# Patient Record
Sex: Female | Born: 1955 | Race: White | Hispanic: No | Marital: Married | Smoking: Former smoker
Health system: Southern US, Community
[De-identification: ages and names within clinical notes are randomized; demographics above are authoritative.]

## PROBLEM LIST (undated history)

## (undated) DIAGNOSIS — I471 Supraventricular tachycardia, unspecified: Secondary | ICD-10-CM

## (undated) DIAGNOSIS — M069 Rheumatoid arthritis, unspecified: Secondary | ICD-10-CM

## (undated) DIAGNOSIS — I4510 Unspecified right bundle-branch block: Secondary | ICD-10-CM

## (undated) DIAGNOSIS — L4050 Arthropathic psoriasis, unspecified: Secondary | ICD-10-CM

## (undated) DIAGNOSIS — E039 Hypothyroidism, unspecified: Secondary | ICD-10-CM

## (undated) DIAGNOSIS — I4891 Unspecified atrial fibrillation: Secondary | ICD-10-CM

## (undated) DIAGNOSIS — N200 Calculus of kidney: Secondary | ICD-10-CM

## (undated) DIAGNOSIS — G4730 Sleep apnea, unspecified: Secondary | ICD-10-CM

## (undated) DIAGNOSIS — T7840XA Allergy, unspecified, initial encounter: Secondary | ICD-10-CM

## (undated) HISTORY — PX: CHOLECYSTECTOMY: SHX55

## (undated) HISTORY — DX: Calculus of kidney: N20.0

## (undated) HISTORY — DX: Arthropathic psoriasis, unspecified: L40.50

## (undated) HISTORY — DX: Hypothyroidism, unspecified: E03.9

## (undated) HISTORY — DX: Unspecified right bundle-branch block: I45.10

## (undated) HISTORY — DX: Sleep apnea, unspecified: G47.30

## (undated) HISTORY — PX: ABDOMINAL HYSTERECTOMY: SHX81

## (undated) HISTORY — DX: Allergy, unspecified, initial encounter: T78.40XA

## (undated) HISTORY — PX: UPPER GASTROINTESTINAL ENDOSCOPY: SHX188

---

## 2005-03-26 HISTORY — PX: ESOPHAGOGASTRODUODENOSCOPY: SHX1529

## 2005-04-23 ENCOUNTER — Ambulatory Visit (HOSPITAL_COMMUNITY): Admission: RE | Admit: 2005-04-23 | Discharge: 2005-04-25 | Payer: Self-pay | Admitting: Neurosurgery

## 2008-03-08 HISTORY — PX: COLONOSCOPY: SHX174

## 2010-08-02 ENCOUNTER — Other Ambulatory Visit (HOSPITAL_COMMUNITY): Payer: Self-pay | Admitting: Neurosurgery

## 2010-08-02 ENCOUNTER — Encounter (HOSPITAL_COMMUNITY)
Admission: RE | Admit: 2010-08-02 | Discharge: 2010-08-02 | Disposition: A | Discharge status: Home / Self Care | Payer: No Typology Code available for payment source | Source: Ambulatory Visit | Attending: Neurosurgery | Admitting: Neurosurgery

## 2010-08-02 DIAGNOSIS — Z01818 Encounter for other preprocedural examination: Secondary | ICD-10-CM | POA: Insufficient documentation

## 2010-08-02 DIAGNOSIS — Z01812 Encounter for preprocedural laboratory examination: Secondary | ICD-10-CM | POA: Insufficient documentation

## 2010-08-02 DIAGNOSIS — Z01810 Encounter for preprocedural cardiovascular examination: Secondary | ICD-10-CM | POA: Insufficient documentation

## 2010-08-02 DIAGNOSIS — M4802 Spinal stenosis, cervical region: Secondary | ICD-10-CM

## 2010-08-02 LAB — CBC
MCH: 33.2 pg (ref 26.0–34.0)
MCHC: 34.2 g/dL (ref 30.0–36.0)
Platelets: 337 10*3/uL (ref 150–400)
RBC: 4.31 MIL/uL (ref 3.87–5.11)

## 2010-08-02 LAB — BASIC METABOLIC PANEL
GFR calc Af Amer: >60 mL/min (ref >60)
GFR calc non Af Amer: >60 mL/min (ref >60)
Potassium: 4.1 mEq/L (ref 3.5–5.1)
Sodium: 141 mEq/L (ref 135–145)

## 2010-08-07 ENCOUNTER — Inpatient Hospital Stay (HOSPITAL_COMMUNITY)
Admission: RE | Admit: 2010-08-07 | Payer: No Typology Code available for payment source | Source: Ambulatory Visit | Admitting: Neurosurgery

## 2010-08-28 ENCOUNTER — Observation Stay (HOSPITAL_COMMUNITY)
Admission: EM | Admit: 2010-08-28 | Discharge: 2010-08-29 | Disposition: A | Discharge status: Home / Self Care | Payer: No Typology Code available for payment source | Attending: Internal Medicine | Admitting: Internal Medicine

## 2010-08-28 ENCOUNTER — Observation Stay (HOSPITAL_COMMUNITY): Payer: No Typology Code available for payment source

## 2010-08-28 ENCOUNTER — Encounter (HOSPITAL_COMMUNITY): Payer: Self-pay | Admitting: Radiology

## 2010-08-28 ENCOUNTER — Emergency Department (HOSPITAL_COMMUNITY): Payer: No Typology Code available for payment source

## 2010-08-28 ENCOUNTER — Encounter: Payer: Self-pay | Admitting: Internal Medicine

## 2010-08-28 DIAGNOSIS — R079 Chest pain, unspecified: Secondary | ICD-10-CM

## 2010-08-28 DIAGNOSIS — I10 Essential (primary) hypertension: Principal | ICD-10-CM | POA: Insufficient documentation

## 2010-08-28 DIAGNOSIS — I4891 Unspecified atrial fibrillation: Secondary | ICD-10-CM | POA: Insufficient documentation

## 2010-08-28 DIAGNOSIS — R51 Headache: Secondary | ICD-10-CM | POA: Insufficient documentation

## 2010-08-28 DIAGNOSIS — Z9849 Cataract extraction status, unspecified eye: Secondary | ICD-10-CM | POA: Insufficient documentation

## 2010-08-28 DIAGNOSIS — I252 Old myocardial infarction: Secondary | ICD-10-CM | POA: Insufficient documentation

## 2010-08-28 DIAGNOSIS — E039 Hypothyroidism, unspecified: Secondary | ICD-10-CM | POA: Insufficient documentation

## 2010-08-28 DIAGNOSIS — L4050 Arthropathic psoriasis, unspecified: Secondary | ICD-10-CM | POA: Insufficient documentation

## 2010-08-28 DIAGNOSIS — M069 Rheumatoid arthritis, unspecified: Secondary | ICD-10-CM | POA: Insufficient documentation

## 2010-08-28 DIAGNOSIS — M4800 Spinal stenosis, site unspecified: Secondary | ICD-10-CM | POA: Insufficient documentation

## 2010-08-28 HISTORY — DX: Unspecified atrial fibrillation: I48.91

## 2010-08-28 HISTORY — DX: Hypothyroidism, unspecified: E03.9

## 2010-08-28 LAB — BASIC METABOLIC PANEL
BUN: 9 mg/dL (ref 6–23)
Calcium: 9.2 mg/dL (ref 8.4–10.5)
Creatinine, Ser: 0.57 mg/dL (ref 0.50–1.10)
GFR calc Af Amer: >60 mL/min (ref >60)
Glucose, Bld: 127 mg/dL — ABNORMAL HIGH (ref 70–99)

## 2010-08-28 LAB — DIFFERENTIAL
Basophils Relative: 0 % (ref 0–1)
Eosinophils Absolute: 0.3 10*3/uL (ref 0.0–0.7)
Eosinophils Relative: 2 % (ref 0–5)
Lymphocytes Relative: 17 % (ref 12–46)
Lymphs Abs: 3 10*3/uL (ref 0.7–4.0)
Monocytes Absolute: 0.8 10*3/uL (ref 0.1–1.0)
Monocytes Relative: 4 % (ref 3–12)
Neutro Abs: 13.4 10*3/uL — ABNORMAL HIGH (ref 1.7–7.7)

## 2010-08-28 LAB — CBC
MCH: 33.2 pg (ref 26.0–34.0)
MCHC: 34.3 g/dL (ref 30.0–36.0)
Platelets: 364 10*3/uL (ref 150–400)
RDW: 14.7 % (ref 11.5–15.5)

## 2010-08-28 LAB — HEPATIC FUNCTION PANEL: ALT: 21 U/L (ref 0–35)

## 2010-08-28 LAB — CK TOTAL AND CKMB (NOT AT ARMC)
CK, MB: 2.1 ng/mL (ref 0.3–4.0)
Relative Index: INVALID (ref 0.0–2.5)
Total CK: 49 U/L (ref 7–177)

## 2010-08-28 LAB — CARDIAC PANEL(CRET KIN+CKTOT+MB+TROPI)
Total CK: 45 U/L (ref 7–177)
Troponin I: <0.3 ng/mL (ref <0.30)

## 2010-08-28 LAB — DIGOXIN LEVEL: Digoxin Level: 0.9 ng/mL (ref 0.8–2.0)

## 2010-08-28 NOTE — H&P (Signed)
Hospital Admission Note Date: 08/28/2010  Patient name: Tina Welch Medical record number: [id number] Date of birth: November 15, 1955 Age: 55 y.o. Gender: female PCP: Temple Pacini, MD  Medical Service: B1  Attending physician:  Josem Kaufmann               Pager: [phone] Resident (R2/R3): Vernice Jefferson [phone] Pager: [phone] Medical Student (R1): Ria Comment              Pager: [phone]  Chief Complaint:Dizzy, Headache  History of Present Illness:Tina Welch is a 55 yo female with a past medical history significant for hypothyroid, RA, psoriasis, psoriatic arthritis, MI, and ruptured discs who presents to the ED with dizziness, headache, and feeling light-headed. Patient states her feelings of nausea and headache began yesterday when she woke up and have progressively worsened since, with the headache (descrobed as head pressure) constant but intermittently more severe. She describes the headache as head pressure and a "vice grip", feeling as if her head is too big for her neck. Patient also feels she has had delayed reaction time and slower overall movements for the past 2 days. She claims she got very carsick yesterday, which is unusual for her and admits to eating less (soup yesterday and a few crackers today) due to a "queasy stomach". She endorses insomnia last night due to not being able to get comfortable. This morning the patient awoke with left sided chest pressure with radiation to her left arm and left neck with associated shortness of breath and stomach ache. Patient denies feeling flu-like during this time, palpitations, RHR, or chest pain, though patient does admit that this felt somewhat similar to her previous MI (in her 40's) and was similarly quickly relieved with Nitro given in the ED. Today at work patient was found to have BP of 190/"100-something". Patient denies vomiting, syncope, blurry vision, flashes of light, scotomas, or unusual numbness/tingling/weakness or loss of sensation to  face or extremities.   Of note, patient claims her thyroid is due to be checked soon and does endorse feeling tremors/shakey for the past few weeks and increased swelling to her eyes. Denies changes to heat/cold intolerance, weight changes, edema or changes to appetite.   Meds:  Allergies: Avolox- neck swelling and rash  Past Medical History  Diagnosis Date  . A-fib   . Hypothyroid   -Obesity -MI: in early 40's, unremarkable angioplasty, found to have a fib and RBBB, currently treated with Digoxin and Metoprolol -Rheumatoid Arthritis -Psoriasis -Psoriatic Arthritis -Ruptured Discs (lumbar-repaired, three current ruptured cervical discs pending upcoming surgical repair) due to Spinal Stenosis -chronic back pain -cataracts-bilateral-repaired  PCP: Dr. Fatima Blank in [place]  Surgical History: -Right L4/L5 microdiscectomy -bilateral cataract repair -Hysterectomy due to endometriosis in 1983 -Cholecystectomy in 1985  Family History: -Father: CRC, A Fib -Mother: HLD, Osteoporosis -HTN and Heart Disease: "both mother's and father's side) -Stroke- "father's side"  History   Social History  . Marital Status: Married    Spouse Name: N/A    Number of Children: 2 kids  . Years of Education: N/A  -Patient was born in [place]. [place] and now lives in [place]. 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 Patient denies any recent stressors. -For exercise patient walks 2-3 times per week. She claims her diet is well-balanced but believes her portions are too large.    Occupational History   Radiographer, therapeutic   Social History Main Topics  . Smoking status: Not on file  . Smokeless tobacco: Not  on file  . Alcohol Use: Not on file  . Drug Use: Not on file  . Sexually Active: Not on file   Other Topics Concern  . Not on file   Social History Narrative  . No narrative on file    Review of Systems: Pertinent items are noted in HPI. Head: Denies head trauma, vomiting  or visual changes Ears: Denies tinnitus Nose: Denies congestion, rhinorrhea Mouth: Denies sore throat Cardiac: as per HPI.  Resp: as per HPI. Resolved SOB (brief episode this morning with her chest pressure) GI: Denies changes to bowel habits such as diarrhea or constipation. Denies abdominal pain.  Neuro: Denies syncope or pain. Endorses chronic tingling and numbness to bilateral hands due to current ruptured cervical discs. Denies paralysis or weakness. Endorses increased tremors for past few weeks.  Endo: as per HPI Psych: Denies anxiety or depression  Physical Exam: Temp 98.6   HR  55    BP  124/80 (125/69)  RR   12          96% on ra General appearance: alert and cooperative Head: Normocephalic, without obvious abnormality, atraumatic Eyes: Increased conjunctival injection bilaterally, EOMI, sclerae anicteric. PERRLA. Neck: no adenopathy, no carotid bruit, no JVD, supple, symmetrical, trachea midline and thyroid not enlarged, symmetric, no tenderness/mass/nodules Lungs: clear to auscultation bilaterally, no increased work of breathing Heart: regular rate and rhythm, normal S1 and S2, no murmurs/rubs/or gallops Abdomen: soft, non-tender; bowel sounds normal; no masses,  no organomegaly Extremities: extremities normal, atraumatic, no cyanosis or edema Pulses: 3+ radial and posterior tibialis pulses bilaterally Skin: Skin color, texture, turgor normal. No rashes or lesions Neurologic: CN 2-12 intact and individually tested, facial movement symmetric. Downgoing babinski. Strength 5-/5 to right dorsiflexion and knee extension.  All other lower extremity strength 5/5 bilaterally. UE strength bilaterally 5-/5 to wrist extension and flexion and grip strength (concurrent with disc rupture, per patient). 3+ radial and achilles reflexes bilaterally.  Lab results:  CBC:    Component Value Date/Time   WBC 17.4* 08/28/2010 1322   HGB 14.4 08/28/2010 1322   HCT 42.0 08/28/2010 1322   PLT 364  08/28/2010 1322   MCV 96.8 08/28/2010 1322   NEUTROABS 13.4* 08/28/2010 1322   LYMPHSABS 3.0 08/28/2010 1322   MONOABS 0.8 08/28/2010 1322   EOSABS 0.3 08/28/2010 1322   BASOSABS 0.1 08/28/2010 1322     Comprehensive Metabolic Panel:    Component Value Date/Time   NA 142 08/28/2010 1322   K 4.5 08/28/2010 1322   CL 107 08/28/2010 1322   CO2 29 08/28/2010 1322   BUN 9 08/28/2010 1322   CREATININE 0.57 08/28/2010 1322   GLUCOSE 127* 08/28/2010 1322   CALCIUM 9.2 08/28/2010 1322   Digoxin                                  0.9               0.8-2.0          Ng/mL  Creatine Kinase, Total                   49                7-177            U/L CK, MB  2.1               0.3-4.0          Ng/mL  Troponin I                               <0.30             <0.30            Ng/mL   Imaging results:   CT He IMPRESSION:   Low density in the occipital regions with punctate hyperdense foci.   Findings probably represent brain edema related to posterior   reversible encephalopathy with punctate hemorrhage.   Other results:  Assessment & Plan by Problem: Patient is a 55 yo female with a past medical history significant for MI, RA, psoriatic arthritis, hypothyroid and ruptured discs 2/2 spinal stenosis who presented to the ED today with 2 days of progressive head pressure, nausea and recent chest pressure and brief shortness of breath. Differential diagnosis includes PRES, hypertensive encephalopathy, MI, stroke, and pseudotumor cerebri. Most likely diagnosis is PRES, due to neuro consult suggestive of PRES along with CT findings consistent with PRES, as well as her clinical symptoms and history (headache, psychomotor slowing) and risk factors (MTX). However, must rule out MI given past history of MI, as well as pseudotumor cerebri given risk factor of obesity and middle age.   1) Hypertension: Patient reports HTN of 190/100's prompting her visit to ED today, but has been  normotensive since admission.  -Will monitor BP and will treat DBP with goal of less than 100 (UpToDate) -Will continue home Digoxin 0.25 mg Qday and home Metoprolol 25 mg Qday -will monitor rhythm with telemetry -pending labs: TSH, hepatic function panel, am CBC and BMET -will obtain MRI/MRA to further investigate puncttate hemorrhages, vs stroke vs PRES  2) Chest Pressure: Given history of chest pressure today similar to that of previous MI, relieved with Nitro, will follow serial cardiac enzymes.  -1st set CE wnl -pending 2 cycles of CE (q6hr) -12 leak EKG in am -Omeprazole 20 mg 1 cap po qday -Asa 81 mg po qday -Tylenol 650 mg 1 tab prn q6hr pain/fever  3) Hypothyroid: -pending TFT's -continue home Synthroid 112 mcg 1 tab PO qday  4) RA: -continue home MTX 2.5 mg po 2 tab on Wed, Th, and 1 tab on Fri and Folic Acid 1 mg po qday  PPX: Lovenox 40 mg [place] Qday Maintenance: continue home Zytrec, Dicyclomine 10 mg po qday

## 2010-08-28 NOTE — H&P (Signed)
Hospital Admission Note Date: 08/28/2010  Patient name: Tina Welch Medical record number: [id number] Date of birth: 1955/04/09 Age: 55 y.o. Gender: female PCP: Temple Pacini, MD  Medical Service: B1  Attending physician:  Dr. Josem Kaufmann                Resident (R2/R3): Laurel Dimmer [phone]  Pager: [phone] Medical Student (R1): Ria Comment              Pager: [phone]  Chief Complaint:Dizzy, Headache  History of Present Illness:[name] is a 55 yo female with a past medical history significant for hypothyroid, RA, psoriasis, psoriatic arthritis, MI, and ruptured discs who presents to the ED with dizziness, headache, and feeling light-headed. Patient states her feelings of nausea and headache began yesterday when she woke up and have progressively worsened since, with the headache (descrobed as head pressure) constant but intermittently more severe. She describes the headache as head pressure and a "vice grip", feeling as if her head is too big for her neck. Patient also feels she has had delayed reaction time and slower overall movements for the past 2 days. She claims she got very carsick yesterday, which is unusual for her and admits to eating less (soup yesterday and a few crackers today) due to a "queasy stomach". She endorses insomnia last night due to not being able to get comfortable. This morning the patient awoke with left sided chest pressure with radiation to her left arm and left neck with associated shortness of breath and stomach ache. Patient denies feeling flu-like during this time, palpitations, RHR, or chest pain, though patient does admit that this felt somewhat similar to her previous MI (in her 40's) and was similarly quickly relieved with Nitro given in the ED. Today at work patient was found to have BP of 190/"100-something". Patient denies vomiting, syncope, blurry vision, flashes of light, scotomas, or unusual numbness/tingling/weakness or loss of sensation to face or  extremities.   Of note, patient claims her thyroid is due to be checked soon and does endorse feeling tremors/shakey for the past few weeks and increased swelling to her eyes. Denies changes to heat/cold intolerance, weight changes, edema or changes to appetite.   Meds:  Allergies: Avelox- neck swelling and rash  Past Medical History  Diagnosis Date  . A-fib   . Hypothyroid   -Obesity -MI: in early 40's, unremarkable angioplasty, found to have a fib and RBBB, currently treated with Digoxin and Metoprolol -Rheumatoid Arthritis -Psoriasis -Psoriatic Arthritis -Ruptured Discs (lumbar-repaired, three current ruptured cervical discs pending upcoming surgical repair) due to Spinal Stenosis -chronic back pain -cataracts-bilateral-repaired  PCP: Dr. Fatima Blank in [place]  Surgical History: -Right L4/L5 microdiscectomy -bilateral cataract repair -Hysterectomy due to endometriosis in 1983 -Cholecystectomy in 1985  Family History: -Father: CRC, A Fib -Mother: HLD, Osteoporosis -HTN and Heart Disease: "both mother's and father's side) -Stroke- "father's side"  History   Social History  . Marital Status: Married    Spouse Name: N/A    Number of Children: 2 kids  . Years of Education: N/A  -Patient was born in [place] and now lives in [place]. 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 Patient denies any recent stressors. -For exercise patient walks 2-3 times per week. She claims her diet is well-balanced but believes her portions are too large.    Occupational History   Radiographer, therapeutic    Review of Systems: Pertinent items are noted in HPI. Head: Denies head trauma, vomiting  or visual changes Ears: Denies tinnitus Nose: Denies congestion, rhinorrhea Mouth: Denies sore throat Cardiac: as per HPI.  Resp: as per HPI. Resolved SOB (brief episode this morning with her chest pressure) GI: Denies changes to bowel habits such as diarrhea or constipation.  Denies abdominal pain.  Neuro: Denies syncope or pain. Endorses chronic tingling and numbness to bilateral hands due to current ruptured cervical discs. Denies paralysis or weakness. Endorses increased tremors for past few weeks.  Endo: as per HPI Psych: Denies anxiety or depression  Physical Exam: Temp 98.6   HR  55    BP  124/80 (125/69)  RR   12          96% on ra General appearance: alert and cooperative Head: Normocephalic, without obvious abnormality, atraumatic Eyes: Increased conjunctival injection bilaterally, EOMI, sclerae anicteric. PERRLA. Neck: no adenopathy, no carotid bruit, no JVD, supple, symmetrical, trachea midline and thyroid not enlarged, symmetric, no tenderness/mass/nodules Lungs: clear to auscultation bilaterally, no increased work of breathing Heart: regular rate and rhythm, normal S1 and S2, no murmurs/rubs/or gallops Abdomen: soft, non-tender; bowel sounds normal; no masses,  no organomegaly Extremities: extremities normal, atraumatic, no cyanosis or edema Pulses: 3+ radial and posterior tibialis pulses bilaterally Skin: Skin color, texture, turgor normal. No rashes or lesions Neurologic: CN 2-12 intact and individually tested, facial movement symmetric. Downgoing babinski. Strength 5-/5 to right dorsiflexion and knee extension.  All other lower extremity strength 5/5 bilaterally. UE strength bilaterally 5-/5 to wrist extension and flexion and grip strength (concurrent with disc rupture, per patient). 3+ radial and achilles reflexes bilaterally.  Lab results:  CBC:    Component Value Date/Time   WBC 17.4* 08/28/2010 1322   HGB 14.4 08/28/2010 1322   HCT 42.0 08/28/2010 1322   PLT 364 08/28/2010 1322   MCV 96.8 08/28/2010 1322   NEUTROABS 13.4* 08/28/2010 1322   LYMPHSABS 3.0 08/28/2010 1322   MONOABS 0.8 08/28/2010 1322   EOSABS 0.3 08/28/2010 1322   BASOSABS 0.1 08/28/2010 1322     Comprehensive Metabolic Panel:    Component Value Date/Time   NA 142 08/28/2010 1322   K  4.5 08/28/2010 1322   CL 107 08/28/2010 1322   CO2 29 08/28/2010 1322   BUN 9 08/28/2010 1322   CREATININE 0.57 08/28/2010 1322   GLUCOSE 127* 08/28/2010 1322   CALCIUM 9.2 08/28/2010 1322   AST 16 08/28/2010 1636   ALT 21 08/28/2010 1636   ALKPHOS 79 08/28/2010 1636   BILITOT 0.3 08/28/2010 1636   PROT 6.8 08/28/2010 1636   ALBUMIN 3.5 08/28/2010 1636   Digoxin                                  0.9               0.8-2.0          Ng/mL  Creatine Kinase, Total                   49                7-177            U/L CK, MB                                          2.1  0.3-4.0          Ng/mL  Troponin I                               <0.30             <0.30            Ng/mL   Imaging results:   CT Head IMPRESSION:   Low density in the occipital regions with punctate hyperdense foci.   Findings probably represent brain edema related to posterior   reversible encephalopathy with punctate hemorrhage.   Assessment & Plan by Problem: Patient is a 55 yo female with a past medical history significant for MI, RA, psoriatic arthritis, hypothyroid and ruptured discs 2/2 spinal stenosis who presented to the ED today with 2 days of progressive head pressure, nausea and recent chest pressure and brief shortness of breath.   1.) Dizziness with elevated blood pressure: Differential diagnosis includes PRES, hypertensive encephalopathy, MI, stroke, orthostatic hypotension, medications, dehydration, electrolyte imbalances, and pseudotumor cerebri. Orthostatic vitals were checked at bedside and patient was not found to be orthostatic. Labs were within normal limits with no evidence of electrolyte abnormalities. Patient not on any opiate medications, and clinically did not appear to be dehydrated. CT scan findings suggestive of PRES given brain edema and punctate hemorrhages as well as her clinical symptoms and history (headache, psychomotor slowing). Causes for PRES include malignant hypertension, and patient had blood  pressure in the 180s which was significantly elevated for her given her baseline blood pressure of less than 100 systolic. Immunosuppressive therapy is also a risk factor and patient is on methotrexate for RA. Also, must rule out MI given past history of MI, as well as pseudotumor cerebri given risk factor of obesity and middle age.  Plan: -Admit to telemetry -Will check MRI/MRA to rule out brain edema and PRES as suggested by curbside neuro consult -Will cycle cardiac enzymes and repeat EKG in am -Will f/u imaging studies and discuss immunosuppressive therapy with attending physician   2) Hypertension: Patient reports HTN of 190/100's prompting her visit to ED today, but has been normotensive since admission.  -Will monitor BP and will treat DBP with goal of less than 100 (UpToDate) -Will continue home Digoxin 0.25 mg Qday and home Metoprolol 25 mg Qday -will monitor rhythm with telemetry  3) Chest Pressure: Given history of chest pressure today similar to that of previous MI, relieved with Nitro, will follow serial cardiac enzymes.  -1st set CE wnl -pending 2 cycles of CE (q6hr) -12 leak EKG in am -Omeprazole 20 mg 1 cap po qday -Asa 81 mg po qday -Tylenol 650 mg 1 tab prn q6hr pain/fever  4) Hypothyroid: -pending TFT's -continue home Synthroid 112 mcg 1 tab PO qday  5) RA: -continue home MTX 2.5 mg po 2 tab on Wed, Th, and 1 tab on Fri and Folic Acid 1 mg po qday  6) A-fib - rate controlled, and on digoxin. [place] VASC score is low and thus she does not need anticoagulation and furthermore, she has punctate hemorrhages on CT.   PPX: Lovenox 40 mg [place] Qday Maintenance: continue home Zytrec, Dicyclomine 10 mg po qday

## 2010-08-28 NOTE — H&P (Incomplete)
Hospital Admission Note Date: 08/28/2010  Patient name: Tina Welch Medical record number: [id number] Date of birth: Mar 14, 1955 Age: 55 y.o. Gender: female PCP: Temple Pacini, MD  Medical Service:  Attending physician:     Pager: Resident (R2/R3):     Pager: Resident (R1):     Pager:  Chief Complaint:  History of Present Illness:  Meds:  Allergies:   Past Medical History  Diagnosis Date  . A-fib   . Hypothyroidism    Surgical History:   No family history on file.  History   Social History  . Marital Status: Married    Spouse Name: N/A    Number of Children: N/A  . Years of Education: N/A   Occupational History  . Not on file.   Social History Main Topics  . Smoking status: Not on file  . Smokeless tobacco: Not on file  . Alcohol Use: Not on file  . Drug Use: Not on file  . Sexually Active: Not on file   Other Topics Concern  . Not on file   Social History Narrative  . No narrative on file    Review of Systems: {Review Of Systems:30496}  Physical Exam:  There were no vitals filed for this visit. {female exam, choose systems:17926} {female exam, choose systems:17872}  Lab results:  Imaging results:   Other results:  Assessment & Plan by Problem:

## 2010-08-29 DIAGNOSIS — R079 Chest pain, unspecified: Secondary | ICD-10-CM

## 2010-08-29 LAB — BASIC METABOLIC PANEL
BUN: 10 mg/dL (ref 6–23)
Chloride: 105 mEq/L (ref 96–112)
GFR calc Af Amer: >60 mL/min (ref >60)
GFR calc non Af Amer: >60 mL/min (ref >60)
Glucose, Bld: 105 mg/dL — ABNORMAL HIGH (ref 70–99)
Potassium: 4.1 mEq/L (ref 3.5–5.1)
Sodium: 139 mEq/L (ref 135–145)

## 2010-08-29 LAB — CBC
HCT: 39.8 % (ref 36.0–46.0)
Hemoglobin: 13.3 g/dL (ref 12.0–15.0)
MCH: 32.4 pg (ref 26.0–34.0)
MCHC: 33.4 g/dL (ref 30.0–36.0)
Platelets: 351 10*3/uL (ref 150–400)

## 2010-08-29 LAB — CARDIAC PANEL(CRET KIN+CKTOT+MB+TROPI)
CK, MB: 1.7 ng/mL (ref 0.3–4.0)
Troponin I: <0.3 ng/mL (ref <0.30)

## 2010-09-06 ENCOUNTER — Encounter (HOSPITAL_COMMUNITY)
Admission: RE | Admit: 2010-09-06 | Discharge: 2010-09-06 | Disposition: A | Discharge status: Home / Self Care | Payer: No Typology Code available for payment source | Source: Ambulatory Visit | Attending: Neurosurgery | Admitting: Neurosurgery

## 2010-09-06 LAB — BASIC METABOLIC PANEL
GFR calc Af Amer: >60 mL/min (ref >60)
GFR calc non Af Amer: >60 mL/min (ref >60)
Potassium: 4.8 mEq/L (ref 3.5–5.1)
Sodium: 140 mEq/L (ref 135–145)

## 2010-09-06 LAB — CBC
Hemoglobin: 14 g/dL (ref 12.0–15.0)
MCHC: 32.9 g/dL (ref 30.0–36.0)
RDW: 15.1 % (ref 11.5–15.5)

## 2010-09-06 LAB — DIFFERENTIAL
Basophils Absolute: 0.1 10*3/uL (ref 0.0–0.1)
Basophils Relative: 0 % (ref 0–1)
Eosinophils Relative: 3 % (ref 0–5)
Monocytes Absolute: 0.7 10*3/uL (ref 0.1–1.0)
Neutro Abs: 8.5 10*3/uL — ABNORMAL HIGH (ref 1.7–7.7)

## 2010-09-06 LAB — TYPE AND SCREEN: Antibody Screen: NEGATIVE

## 2010-09-08 ENCOUNTER — Inpatient Hospital Stay (HOSPITAL_COMMUNITY)
Admission: RE | Admit: 2010-09-08 | Discharge: 2010-09-09 | DRG: 473 | Disposition: A | Discharge status: Home / Self Care | Payer: No Typology Code available for payment source | Source: Ambulatory Visit | Attending: Neurosurgery | Admitting: Neurosurgery

## 2010-09-08 ENCOUNTER — Inpatient Hospital Stay (HOSPITAL_COMMUNITY): Payer: No Typology Code available for payment source

## 2010-09-08 DIAGNOSIS — M47812 Spondylosis without myelopathy or radiculopathy, cervical region: Principal | ICD-10-CM | POA: Diagnosis present

## 2010-09-08 DIAGNOSIS — E039 Hypothyroidism, unspecified: Secondary | ICD-10-CM | POA: Diagnosis present

## 2010-09-08 DIAGNOSIS — I498 Other specified cardiac arrhythmias: Secondary | ICD-10-CM | POA: Diagnosis present

## 2010-09-08 DIAGNOSIS — M5020 Other cervical disc displacement, unspecified cervical region: Secondary | ICD-10-CM | POA: Diagnosis present

## 2010-09-08 DIAGNOSIS — Z7982 Long term (current) use of aspirin: Secondary | ICD-10-CM

## 2010-09-08 DIAGNOSIS — Z01812 Encounter for preprocedural laboratory examination: Secondary | ICD-10-CM

## 2010-09-08 DIAGNOSIS — Z79899 Other long term (current) drug therapy: Secondary | ICD-10-CM

## 2010-09-09 ENCOUNTER — Emergency Department (HOSPITAL_COMMUNITY)
Admission: EM | Admit: 2010-09-09 | Discharge: 2010-09-10 | Disposition: A | Discharge status: Home / Self Care | Payer: No Typology Code available for payment source | Attending: Emergency Medicine | Admitting: Emergency Medicine

## 2010-09-09 ENCOUNTER — Emergency Department (HOSPITAL_COMMUNITY): Payer: No Typology Code available for payment source

## 2010-09-09 DIAGNOSIS — R061 Stridor: Secondary | ICD-10-CM | POA: Insufficient documentation

## 2010-09-09 DIAGNOSIS — R062 Wheezing: Secondary | ICD-10-CM | POA: Insufficient documentation

## 2010-09-09 DIAGNOSIS — R0602 Shortness of breath: Secondary | ICD-10-CM | POA: Insufficient documentation

## 2010-09-09 DIAGNOSIS — E039 Hypothyroidism, unspecified: Secondary | ICD-10-CM | POA: Insufficient documentation

## 2010-09-09 DIAGNOSIS — Z981 Arthrodesis status: Secondary | ICD-10-CM | POA: Insufficient documentation

## 2010-09-09 DIAGNOSIS — F411 Generalized anxiety disorder: Secondary | ICD-10-CM | POA: Insufficient documentation

## 2010-09-09 DIAGNOSIS — M069 Rheumatoid arthritis, unspecified: Secondary | ICD-10-CM | POA: Insufficient documentation

## 2010-09-09 LAB — CBC
MCH: 32.3 pg (ref 26.0–34.0)
MCHC: 33.9 g/dL (ref 30.0–36.0)
Platelets: 361 10*3/uL (ref 150–400)

## 2010-09-09 LAB — DIFFERENTIAL
Basophils Relative: 0 % (ref 0–1)
Eosinophils Absolute: 0.1 10*3/uL (ref 0.0–0.7)
Eosinophils Relative: 1 % (ref 0–5)
Monocytes Absolute: 0.9 10*3/uL (ref 0.1–1.0)
Monocytes Relative: 5 % (ref 3–12)

## 2010-09-09 LAB — BASIC METABOLIC PANEL
Calcium: 9.4 mg/dL (ref 8.4–10.5)
GFR calc non Af Amer: >60 mL/min (ref >60)
Glucose, Bld: 117 mg/dL — ABNORMAL HIGH (ref 70–99)
Sodium: 141 mEq/L (ref 135–145)

## 2010-09-13 ENCOUNTER — Other Ambulatory Visit (HOSPITAL_COMMUNITY): Payer: No Typology Code available for payment source

## 2010-09-13 NOTE — Discharge Summary (Signed)
NAME:  Tina Welch, Tina Welch.:  [id number]  MEDICAL RECORD NO.:  [id number]  LOCATION:  3734                         FACILITY:  [facility]  PHYSICIAN:  Doneen Poisson, MD     DATE OF BIRTH:  30-Apr-1955  DATE OF ADMISSION:  08/28/2010 DATE OF DISCHARGE:  08/29/2010                              DISCHARGE SUMMARY   DISCHARGE DIAGNOSES: 1. Hypertensive urgency. 2. Chronic problems.     a.     Atrial fibrillation.     b.     Hypothyroid.     c.     History of myocardial infarction in early 82s with      unremarkable angioplasty.     d.     Rheumatoid arthritis.     e.     Psoriasis.     f.     Psoriatic arthritis.     g.     Spinal stenosis.     h.     Repaired bilateral cataracts.  DISCHARGE MEDICATIONS: 1. Zyrtec 10 mg p.o. daily. 2. Synthroid 100 mcg 1 tablet p.o. daily. 3. Omeprazole 20 mg 1 capsule p.o. daily. 4. Metoprolol 25 mg XL p.o. daily. 5. Methotrexate 2.5 mg p.o. 2 tablets on Wednesday and Thursday and 1     tablet on Friday. 6. Folic acid 1 mg p.o. daily. 7. Digoxin 0.25 mg 1 tablet p.o. daily. 8. Cymbalta 60 mg p.o. daily. 9. Aspirin 81 mg p.o. daily. 10.Tylenol 650 mg 1 tablet p.o. q.6 h. p.r.n. pain or fever.  DISPOSITION AND FOLLOWUP: a.  Tina Welch is to see her PCP, Dr. Georg Ruddle, [phone]  on August 31, 2010, at 3:45 p.m.  She is to get free T4 drawn and follow  up regarding Synthroid dosing. b.  She is scheduled to obtain a stress echo on September 13, 2010, at 1:30 at [facility] for her preoperative clearance per Dr. Jordan Likes.  The patient is to arrive by 1 p.m.  PROCEDURES PERFORMED:  None.  CONSULTATIONS:  None.  BRIEF ADMITTING HISTORY AND PHYSICAL:  Tina Welch is a 55 year old woman with a past medical history significant for hypothyroidism, RA, psoriasis, psoriatic arthritis, MI, and ruptured disk who presents to the ED with dizziness, headache, and feeling lightheaded.  She states her feelings of nausea and headache  began yesterday when she woke up and had progressively worsened since then with the headache described as head pressure, constant, but intermittently more severe.  She describes the headache as head pressure and a vise grip, feeling as if her head is too big for her neck.  She also feels she has had delayed reaction time and slower overall movements for the past 2 days.  She claims she got very carsick yesterday, which was unusual for her and admits to eating less (soup yesterday and a few crackers today) due to a "queasy stomach."  She endorses insomnia last night due to not being able to get comfortable.  This morning she awoke with left-sided chest pressure with radiation to her left arm and left neck with associated shortness of breath and stomachache.  She denies feeling flu-like during this time, palpitations, RHR, or chest  pain, though she does admit that this felt somewhat similar to her previous MI in her 60s and was similarly quickly relieved with nitro given in the ED.  Today at work, she was found to have a blood pressure of 198/"100 something."  She denied vomiting, syncope, blurry vision, flashes of light, scotomas, or unusual numbness, tingling, weakness, or loss of sensation to face or extremities.  Of note, she claims her thyroid is due to be checked soon and does endorse feeling tremors for the past few weeks and increased swelling to her eye.  She denies changes to heat or cold intolerance, weight changes, edema, or changes to appetite.  On admission, she was normotensive with stable vital signs. Head CT was concerning for PRES given evidence of cerebral edema with punctate hemorrhages, however, MRI was unremarkable.  She was given nitroglycerin in the ED and serial cardiac enzymes obtained were all normal.  PHYSICAL EXAMINATION: VITAL SIGNS:  Temperature 98.6, heart rate 55, blood pressure 124/80,  RR 12, and O2 sat 96% on room air. GENERAL:  Alert, oriented,  cooperative, well groomed, no acute distress. HEENT:  NCAT.  Increased conjunctival injection bilaterally.  EOMI. Sclerae anicteric.  PERRLA.  No JVD or adenopathy.  MMM. RESPIRATORY:  CTAB.  No increased work of breathing. CARDIAC:  RRR.  Normal S1 and S2.  No murmurs audible.  +3 radial and posterior tibial pulses bilaterally. ABDOMEN:  Soft, nontender, and nondistended.  NABS.  No masses or organomegaly. EXTREMITIES:  No cyanosis or edema.  Atraumatic. SKIN:  Normal skin turgor.  No rashes or lesions. NEUROLOGIC:  Cranial nerves II through XII intact and individually tested.  Facial movements symmetric.  Downgoing Babinski.  Strength 5-/5 to right dorsiflexion and knee extension; all other lower extremity strength 5/5 bilaterally.  Upper extremity strength 5-/5 to wrist extension and flexion and grip strength, (concurrent with disk rupture per the patient).  Brisk 3+ reflexes to brachialis and Achilles tendon bilaterally.  ADMISSION LABORATORY DATA:  White blood cell count 17.4, H and H 14.4 and 42.0, platelet 364, MCV 96.8, absolute neutrophils 13.4, absolute lymphocytes 3.0, monocytes 0.8.  Sodium 142, potassium 4.5, chloride 107, CO2 29, BUN 9, creatinine 0.57, glucose 127, calcium 9.2. Digoxin 0.9.  CK 49, CK-MB 2.1, troponins less than 0.3.  TSH 0.031.  HOSPITAL COURSE: 1. Hypertensive urgency:  Ms. Jeangilles reports progressive increased     head pressure, headache for 2 days with nausea, tremors, queasy     stomach, motor retardation, and blood pressure of 190s/100, taken     by the local [employer] immediately before arrival to the ED     yesterday; however, she has been normotensive since admission.     Various etiologies for dizziness were investigated.  Orthostatics     were tested and she was not found to have orthostatic     hypotension.  She was not dehydrated and labs obtained also did not     indicate dehydration given BUN and creatinine within normal  limits.     Electrolyte abnormalities not a cause of her symptoms as basic     metabolic panel found no abnormalities.  Cardiac etiology of     hypertension and tremors investigated.  She was monitored on     telemetry throughout the hospital stay with no events.  Unremarkable     ECGs throughout the hospital course and serial cardiac enzymes did not     indicate MI.  Head CT obtained in the ED suggested  cerebral edema     and punctate hemorrhages indicative of PRES as possible cause of     her symptoms, however, the head MRI obtained the night of     admission was unremarkable and did not show any signs of edema or     hemorrhage.  Additionally, TSH was significant for marked     suppression to 0.031, consistent with an excess exogenous     Synthroid causing hypertensive urgency.  Given her     suppressed TSH, history of recent tremors and hypertensive urgency,     the Synthroid dose was decreased from 112 mcg per day to a more ideal     dose of 100 mcg per day on the day of discharge.  PCP to follow up     with her regarding her Synthroid dose and to recheck her     free T4.  2. Chest pain:  Ms. Jupiter reported chest pressure on the day of     admission similar to that of her previous MI.  However, 3 cycles of     cardiac enzymes were all within normal limits.  Additionally, an ECG     was normal.  She was continued on daily aspirin 81 mg     daily throughout the hospitalization.  She did not need any     p.r.n. Tylenol for pain.  3. Hypothyroid:  Ms. Fata was found to have a suppressed TSH on     admission.  Thus, the Synthroid dose adjusted to a more appropriate     level of 100 mcg per day.  She is to follow up with her PCP     regarding Synthroid dosing and to get free T4 checked.  4. Rheumatoid Arthritis:  She continued on her methotrexate regimen for     RA, psoriatic arthritis and continued on these medications at     discharge.  5. Spinal stenosis:  She is scheduled  for an operative procedure     regarding her cervical spinal stenosis and was instructed to follow     up with her PCP regarding preoperative clearance.  DISCHARGE LABORATORY DATA AND VITAL SIGNS:  Sodium 139, chloride 105, BUN 10, potassium 4.1, bicarb 26, creatinine 0.61, glucose 105. White blood cell count 16.1, H and H 13.3 and 39.8, platelets 351. TSH 0.031, lower limit of normal 0.35.  CK 44, CK-MB 1.7.  Troponin  less than 0.3.  Temperature 98.9, blood pressure 121/84, pulse 64, and respiratory rate 18, saturating 98% on room air.   ______________________________ Melida Quitter, MD   ______________________________ Doneen Poisson, MD   AS/MEDQ  D:  09/02/2010  T:  09/02/2010  Job:  [id number]  cc:   Dr. Georg Ruddle  Electronically Signed by Melida Quitter  on 09/06/2010 02:10:57 PM Electronically Signed by Doneen Poisson  on 09/13/2010 06:35:05 PM

## 2010-09-14 NOTE — Op Note (Signed)
NAME:  Tina Welch, LOUTH.:  [id number]  MEDICAL RECORD NO.:  [id number]  LOCATION:  3536                         FACILITY:  [facility]  PHYSICIAN:  Tina Maser. Yoshito Gaza, M.D.    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  PROCEDURE:  C3-4, C4-5, C5-6 anterior cervical diskectomy and fusion with allograft and plating.  SURGEON:  Tina Maser. Gwendlyn Hanback, MD  ASSISTANT:  Reinaldo Meeker, MD  ANESTHESIA:  General endotracheal.  INDICATIONS:  Tina Welch is a 55 year old female with history of neck and predominant left upper extremity pain failing conservative measures. Workup demonstrates evidence of a left paracentral disk herniation with spinal cord compression, marked spondylosis with foraminal stenosis at C4-5 and C5-6.  We discussed the options for management.  She has failed conservative management and presents now for three-level anterior cervical decompression and fusion in hopes of improving her symptoms.  OPERATIVE NOTE:  The patient was taken to the operating room and placed on the op table in supine position.  After adequate level of anesthesia was achieved, the patient was positioned supine, neck slightly extended and held up with halter traction.  The anterior cervical region was prepped and draped sterilely.  A 10 blade was used to make a linear skin incision overlying the C4-5 vertebral level.  This was carried down sharply to the platysma.  The platysma was divided vertically and dissection proceeded on the medial border of the sternomastoid muscle and carotid sheath.  Trachea and esophagus were mobilized and tracked towards the left.  Through the prevertebral fascia we stripped off the anterior spinal column.  Longus coli muscle was then  elevated bilaterally using electrocautery.  Deep self-retaining retractors were placed.  Intraoperative fluoroscopy was used and levels were confirmed. Disk space at C3-4, C4-5 and C5-6 were then incised with a 15 blade in rectangular fashion.  Wide disk space clean-out was then achieved using pituitary rongeurs, forward and backward Karlin curettes, Kerrison rongeurs, high-speed drill.  All elements of disk were removed down to the level of the posterior annulus.  Starting first at C3-4, the remaining aspects of the disk and annulus were removed using high-speed drill down to the level of the posterior longitudinal ligament.  The posterior longitudinal ligament was then elevated and resected in piecemeal fashion using Kerrison rongeurs.  The underlying thecal sac was then identified.  A wide central decompression was then performed by undercutting the bodies of C3-C4.  Decompression then proceeded out in each neural foramen.  Wide anterior foraminotomies were then performed along the course of the exiting nerve roots bilaterally.  At this point, a very thorough decompression have been achieved.  All elements of disk herniation were resected and there was no evidence of injury to thecal sac or nerve roots.  Procedure then repeated at C4-5 and C5-6 again without complication.  Wound was then irrigated with antibiotic solution.  Gelfoam was placed topically for hemostasis which was found to be good.  A 6-mm  cornerstone allograft wedge was then impacted into place and recessed approximately 1 mm from the anterior cortical margin at C3-4, C4-5, and C5-6.  A 60-mm Atlantis translational plate was then placed over the C3, C4, C5, C6 levels.  This was then attached under fluoroscopic guidance using 13 mm fixed angle screws.  All eight screws were given final tightening and found to be solid within bone.  The locking screws were engaged at all four levels.  Final images revealed good position of  the bone grafts, proper hardware level and normal alignment of the spine.  Wound was then irrigated with antibiotic solution.  Hemostasis was ensured with bipolar electrocautery.  The wounds were closed in layers.  Steri-Strips and sterile dressings were applied.  There were no complications.  The patient tolerated the procedure well and she returns to recovery room postoperatively.          ______________________________ Tina Welch, M.D.     HAP/MEDQ  D:  09/08/2010  T:  09/08/2010  Job:  [id number]  Electronically Signed by Julio Sicks M.D. on 09/14/2010 11:47:33 PM

## 2010-09-15 NOTE — Consult Note (Signed)
  NAME:  Tina Welch, Tina Welch.:  [id number]  MEDICAL RECORD NO.:  [id number]  LOCATION:  [facility]                         FACILITY:  [facility]  PHYSICIAN:  Donalee Citrin, M.D.        DATE OF BIRTH:  August 19, 1955  DATE OF CONSULTATION:  09/09/2010 DATE OF DISCHARGE:  09/10/2010                                CONSULTATION   REASON FOR CONSULTATION:  Shortness of breath, status post being discharged after a cervical surgery.  The patient is a very pleasant 55 year old female who underwent anterior cervical diskectomy and fusion yesterday, did very well overnight, was discharged from the hospital this morning, however, this evening started becoming increasingly more short of breath and she felt the secretions were keeping her from breathing.  The husband called me.  We sent them to the emergency room.  On arrival to the emergency department, she was noted to be in a florid, anxiety reaction.  She was given some Ativan, she calmed down considerably.  Chest x-ray was obtained and the patient was being sent for CT scan of her neck.  On exam, her wound was flat. She is neurologically intact.  She is feeling much better.  Plan is to evaluate the CT scan of the neck to rule out compressive airway.  In addition, evaluated her chest x-ray to rule out pneumonia or atelectasis.  If her CT and her chest x-ray are okay, she can be discharged on a Medrol Dosepak as well as maybe some Xanax for anxiety and schedule followup with Dr. Jordan Likes next week.          ______________________________ Donalee Citrin, M.D.     GC/MEDQ  D:  09/09/2010  T:  09/10/2010  Job:  [id number]  Electronically Signed by Donalee Citrin M.D. on 09/15/2010 07:25:39 AM

## 2010-09-28 ENCOUNTER — Ambulatory Visit (HOSPITAL_COMMUNITY): Admit: 2010-09-28 | Payer: Self-pay | Admitting: Interventional Radiology

## 2010-10-23 NOTE — Discharge Summary (Signed)
  NAME:  CARLOS, QUACKENBUSH.:  [id number]  MEDICAL RECORD NO.:  [id number]  LOCATION:  3536                         FACILITY:  [facility]  PHYSICIAN:  Kathaleen Maser. Atharva Mirsky, M.D.    DATE OF BIRTH:  09/24/1955  DATE OF ADMISSION:  09/08/2010 DATE OF DISCHARGE:  09/09/2010                              DISCHARGE SUMMARY   FINAL DIAGNOSIS:  C3-4, C4-5, and C5-6 spondylosis with stenosis.  HISTORY OF PRESENT ILLNESS:  Ms. Thielke is a 55 year old female history of neck pain and left lower extremity symptoms, failing conservative management.  Workup demonstrates evidence of multilevel stenosis secondary to spondylosis.  She presents now for three-level anterior decompression.  HOSPITAL COURSE:  The patient was taken to the operating room where uncomplicated three-level anterior cervical decompression and fusion was performed.  Postoperatively, the patient awakened with improvement of her left lower extremity pain.  She has an intact neurological function. Wound is healing well.  Plan is for discharge home.  CONDITION ON DISCHARGE:  Improved.          ______________________________ Kathaleen Maser Taia Bramlett, M.D.     HAP/MEDQ  D:  09/26/2010  T:  09/26/2010  Job:  [id number]  Electronically Signed by Julio Sicks M.D. on 10/23/2010 07:44:39 AM

## 2010-11-08 ENCOUNTER — Other Ambulatory Visit: Payer: Self-pay | Admitting: Neurosurgery

## 2010-11-08 ENCOUNTER — Ambulatory Visit
Admission: RE | Admit: 2010-11-08 | Discharge: 2010-11-08 | Disposition: A | Discharge status: Home / Self Care | Payer: No Typology Code available for payment source | Source: Ambulatory Visit | Attending: Neurosurgery | Admitting: Neurosurgery

## 2010-11-08 DIAGNOSIS — M542 Cervicalgia: Secondary | ICD-10-CM

## 2011-09-08 IMAGING — CT CT NECK W/O CM
4 of 5 series · 16 of 33 positions shown, 18 images · IV contrast (APPLIED)
Comparison: Upper cervical spine obtained yesterday.

CLINICAL DATA: Shortness of breath.  Cervical spine surgery
yesterday.

CT NECK WITHOUT CONTRAST
TECHNIQUE: Multidetector CT imaging of the neck was performed
without intravenous contrast.

[Series 3: st neck 2.0 b31s · axial · 0.46mm/px · z∈[+886,+1046]mm · 4 of 134 slices shown, 5 images]
[im 27/134  soft-tissue]
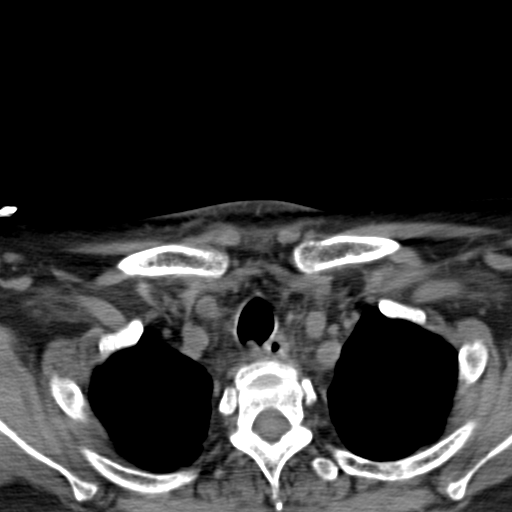
[im 27/134  bone]
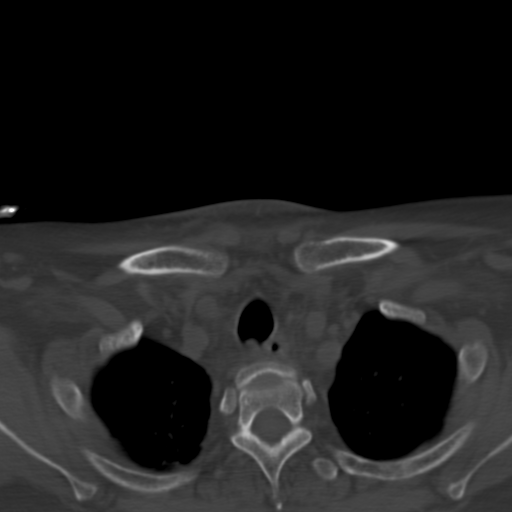
[im 54/134  bone]
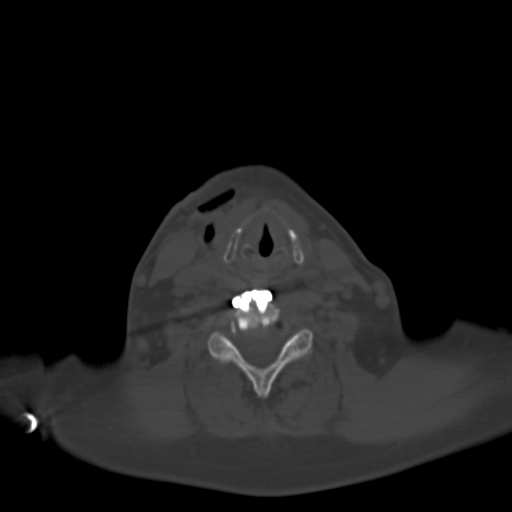
[im 80/134  bone]
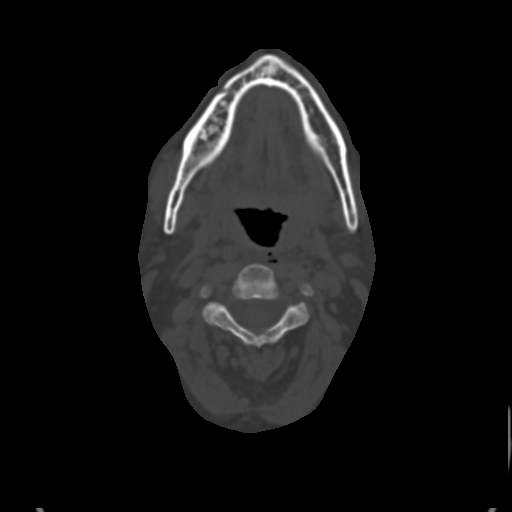
[im 107/134  bone]
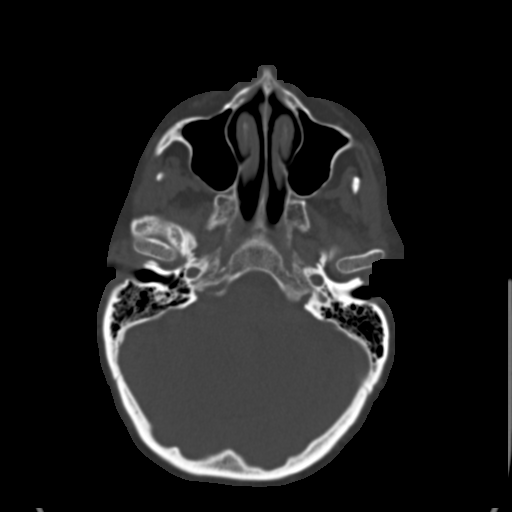

[Series 605: orthog · axial · 0.52mm/px · z∈[+857,+1000]mm · 4 of 123 slices shown]
[im 25/123  bone]
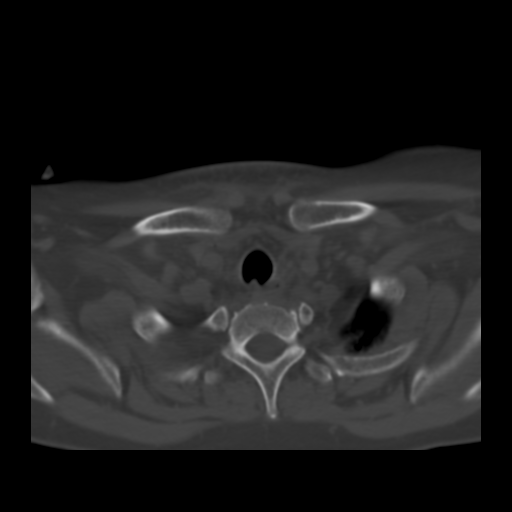
[im 49/123  bone]
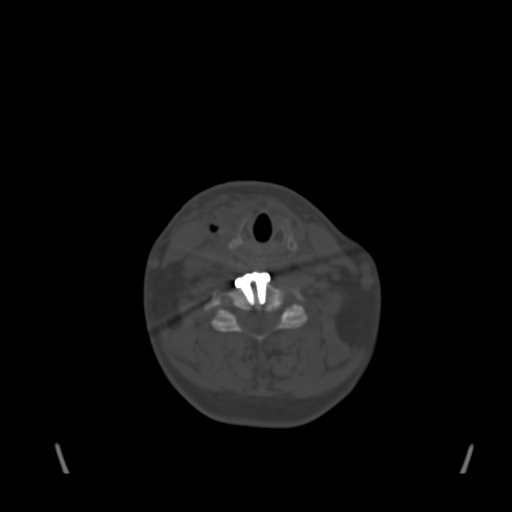
[im 74/123  bone]
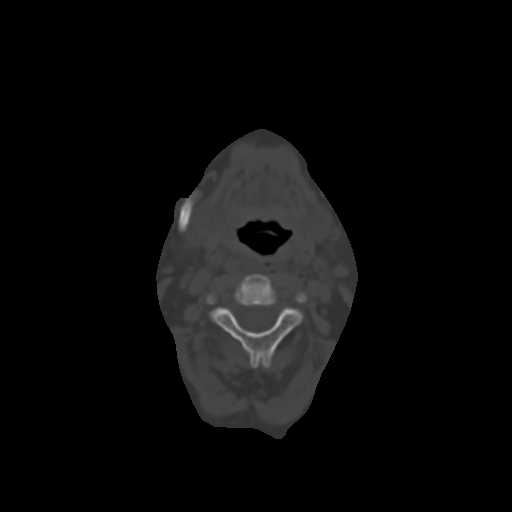
[im 98/123  bone]
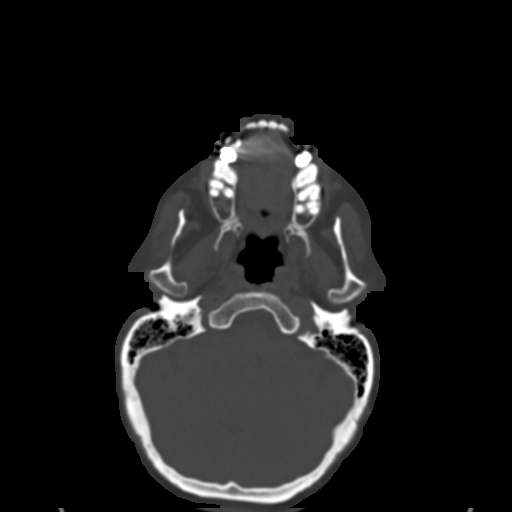

[Series 606: coronal · coronal · 0.52mm/px · 3 of 73 slices shown]
[im 15/73  bone]
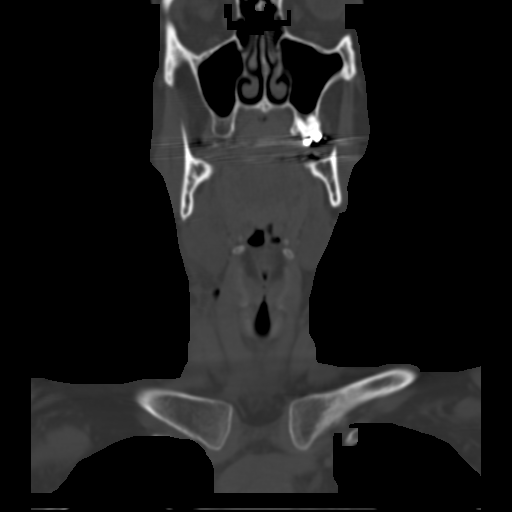
[im 29/73  bone]
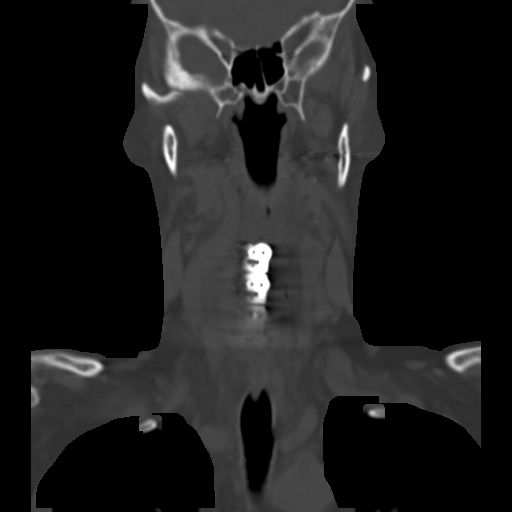
[im 44/73  bone]
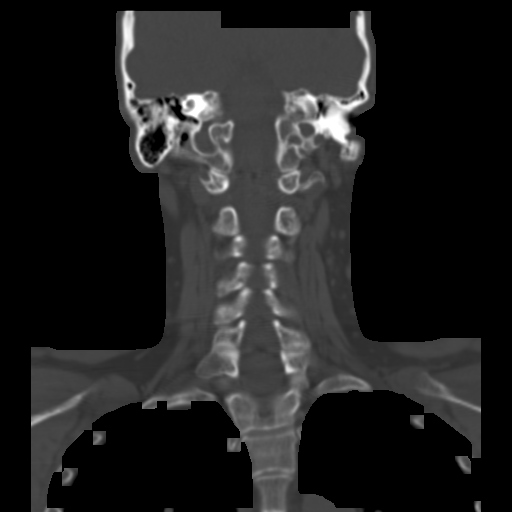

[Series 607: sagittal · sagittal · 0.52mm/px · 5 of 73 slices shown, 6 images]
[im 25/73  bone]
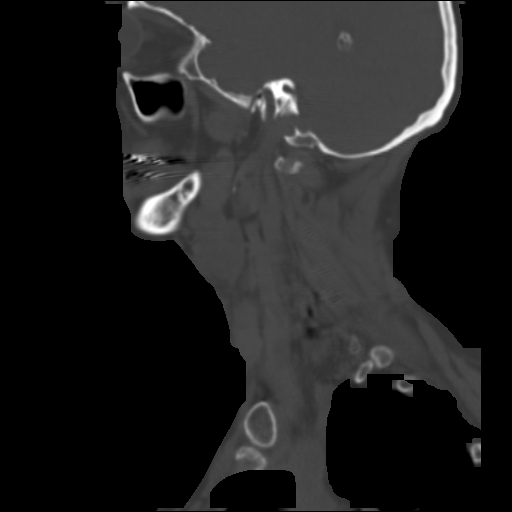
[im 31/73  bone]
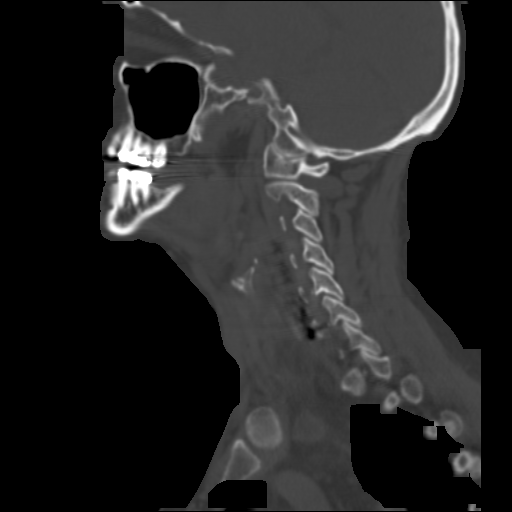
[im 37/73  soft-tissue]
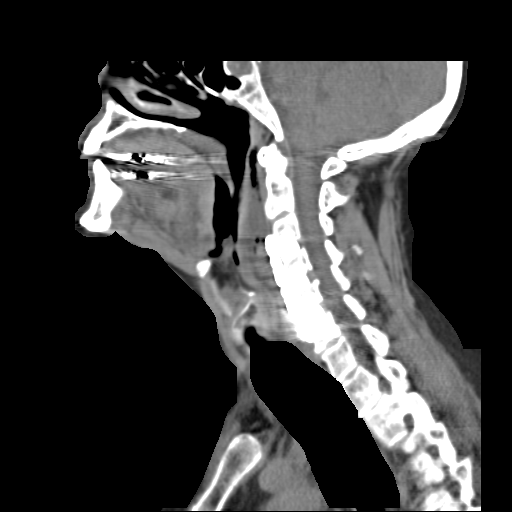
[im 37/73  bone]
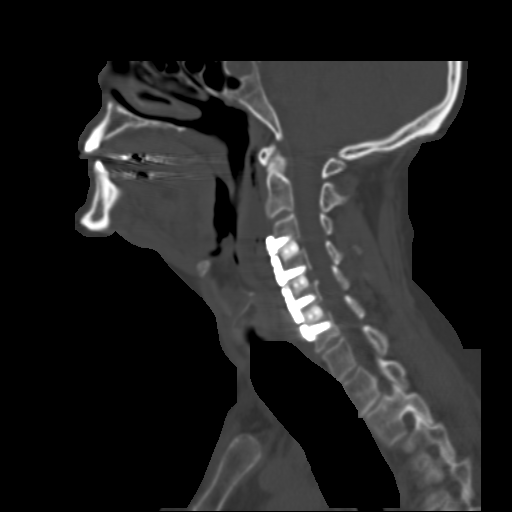
[im 43/73  bone]
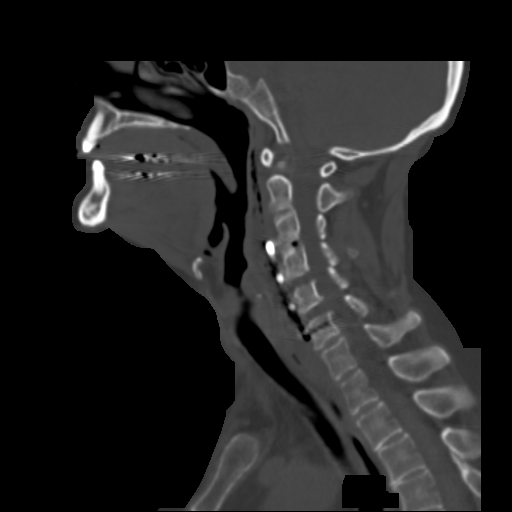
[im 49/73  bone]
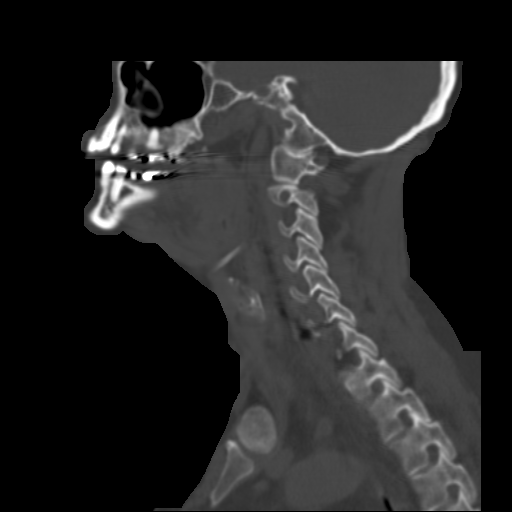

[16 of 33 positions shown; findings below may reference images not displayed]

FINDINGS: Interbody bone plug and anterior screw and plate fixation
at the C3 through the C6 levels.  Normal alignment.  Associated
prevertebral soft tissue swelling and hematoma, within normal
limits of expected immediate postoperative changes. There is also
postoperative prevertebral air and postoperative air in the soft
tissues of the neck on the right, anteriorly.  No abnormal airway
narrowing.  Clear lung apices.
IMPRESSION: Expected postoperative changes, as described above.

## 2011-09-08 IMAGING — CR DG CHEST 1V PORT
1 series · 1 of 1 positions shown · non-contrast
Comparison: 08/28/2010.

CLINICAL DATA: Shortness of breath.  Neck pain and swelling.
Cervical spine surgery yesterday.

PORTABLE CHEST - 1 VIEW

[AP]
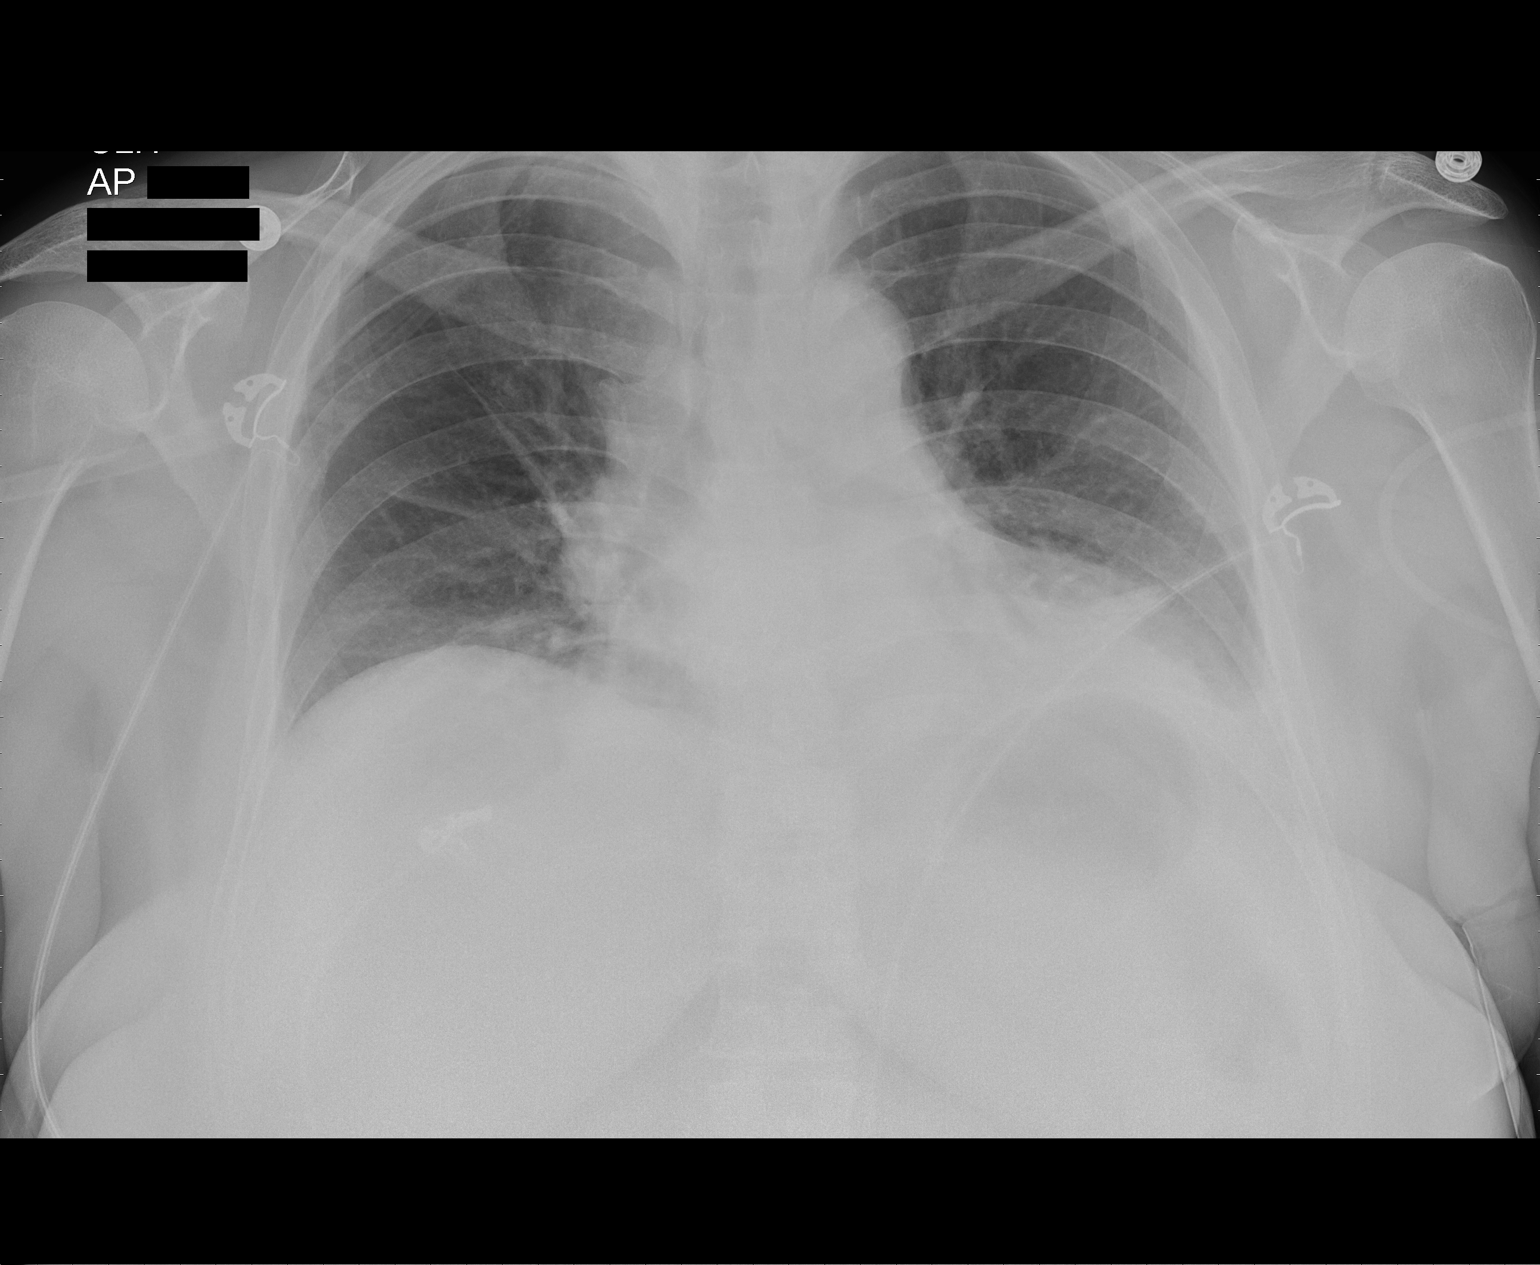

[1 of 1 positions shown; findings below may reference images not displayed]

FINDINGS: A very poor inspiration is currently demonstrated with
interval bibasilar and bilateral perihilar atelectasis, greatest at
the left lung base.  Interval cervical spine fixation hardware.
Normal sized heart.
IMPRESSION: Poor inspiration with bilateral atelectasis.

## 2011-10-28 DIAGNOSIS — F439 Reaction to severe stress, unspecified: Secondary | ICD-10-CM | POA: Insufficient documentation

## 2011-10-28 DIAGNOSIS — I471 Supraventricular tachycardia, unspecified: Secondary | ICD-10-CM | POA: Insufficient documentation

## 2012-01-15 DIAGNOSIS — I493 Ventricular premature depolarization: Secondary | ICD-10-CM | POA: Insufficient documentation

## 2012-02-27 DIAGNOSIS — R569 Unspecified convulsions: Secondary | ICD-10-CM

## 2012-02-27 HISTORY — DX: Unspecified convulsions: R56.9

## 2012-04-28 DIAGNOSIS — R29810 Facial weakness: Secondary | ICD-10-CM | POA: Insufficient documentation

## 2012-04-30 DIAGNOSIS — R079 Chest pain, unspecified: Secondary | ICD-10-CM | POA: Insufficient documentation

## 2014-07-08 DIAGNOSIS — H04209 Unspecified epiphora, unspecified lacrimal gland: Secondary | ICD-10-CM | POA: Insufficient documentation

## 2014-07-08 DIAGNOSIS — H532 Diplopia: Secondary | ICD-10-CM | POA: Insufficient documentation

## 2014-07-08 DIAGNOSIS — E079 Disorder of thyroid, unspecified: Secondary | ICD-10-CM | POA: Insufficient documentation

## 2014-07-12 DIAGNOSIS — H02831 Dermatochalasis of right upper eyelid: Secondary | ICD-10-CM | POA: Insufficient documentation

## 2014-07-12 DIAGNOSIS — H02422 Myogenic ptosis of left eyelid: Secondary | ICD-10-CM | POA: Insufficient documentation

## 2014-07-12 DIAGNOSIS — H16213 Exposure keratoconjunctivitis, bilateral: Secondary | ICD-10-CM | POA: Insufficient documentation

## 2014-07-12 DIAGNOSIS — G5139 Clonic hemifacial spasm, unspecified: Secondary | ICD-10-CM | POA: Insufficient documentation

## 2014-07-12 DIAGNOSIS — H0520 Unspecified exophthalmos: Secondary | ICD-10-CM | POA: Insufficient documentation

## 2014-10-21 DIAGNOSIS — E890 Postprocedural hypothyroidism: Secondary | ICD-10-CM | POA: Insufficient documentation

## 2014-11-19 DIAGNOSIS — H05249 Constant exophthalmos, unspecified eye: Secondary | ICD-10-CM | POA: Insufficient documentation

## 2015-01-10 DIAGNOSIS — I519 Heart disease, unspecified: Secondary | ICD-10-CM | POA: Insufficient documentation

## 2015-01-10 DIAGNOSIS — R569 Unspecified convulsions: Secondary | ICD-10-CM | POA: Insufficient documentation

## 2015-03-11 DIAGNOSIS — H18529 Epithelial (juvenile) corneal dystrophy, unspecified eye: Secondary | ICD-10-CM | POA: Insufficient documentation

## 2015-03-11 DIAGNOSIS — H16012 Central corneal ulcer, left eye: Secondary | ICD-10-CM | POA: Insufficient documentation

## 2015-03-11 DIAGNOSIS — H5712 Ocular pain, left eye: Secondary | ICD-10-CM | POA: Insufficient documentation

## 2015-04-13 DIAGNOSIS — Z961 Presence of intraocular lens: Secondary | ICD-10-CM | POA: Insufficient documentation

## 2015-04-13 DIAGNOSIS — H16143 Punctate keratitis, bilateral: Secondary | ICD-10-CM | POA: Insufficient documentation

## 2015-11-30 DIAGNOSIS — J302 Other seasonal allergic rhinitis: Secondary | ICD-10-CM | POA: Insufficient documentation

## 2015-11-30 DIAGNOSIS — M5030 Other cervical disc degeneration, unspecified cervical region: Secondary | ICD-10-CM | POA: Insufficient documentation

## 2015-11-30 DIAGNOSIS — M5136 Other intervertebral disc degeneration, lumbar region: Secondary | ICD-10-CM | POA: Insufficient documentation

## 2015-11-30 DIAGNOSIS — Z800 Family history of malignant neoplasm of digestive organs: Secondary | ICD-10-CM | POA: Insufficient documentation

## 2015-11-30 DIAGNOSIS — F32A Depression, unspecified: Secondary | ICD-10-CM | POA: Insufficient documentation

## 2015-11-30 DIAGNOSIS — M0609 Rheumatoid arthritis without rheumatoid factor, multiple sites: Secondary | ICD-10-CM | POA: Insufficient documentation

## 2015-11-30 DIAGNOSIS — E039 Hypothyroidism, unspecified: Secondary | ICD-10-CM | POA: Insufficient documentation

## 2016-02-15 DIAGNOSIS — D3501 Benign neoplasm of right adrenal gland: Secondary | ICD-10-CM | POA: Insufficient documentation

## 2016-02-15 DIAGNOSIS — H0520 Unspecified exophthalmos: Secondary | ICD-10-CM | POA: Insufficient documentation

## 2016-02-15 DIAGNOSIS — K219 Gastro-esophageal reflux disease without esophagitis: Secondary | ICD-10-CM | POA: Insufficient documentation

## 2016-02-15 DIAGNOSIS — K760 Fatty (change of) liver, not elsewhere classified: Secondary | ICD-10-CM | POA: Insufficient documentation

## 2016-02-15 DIAGNOSIS — R918 Other nonspecific abnormal finding of lung field: Secondary | ICD-10-CM | POA: Insufficient documentation

## 2016-02-15 DIAGNOSIS — R7989 Other specified abnormal findings of blood chemistry: Secondary | ICD-10-CM | POA: Insufficient documentation

## 2016-02-15 DIAGNOSIS — I4510 Unspecified right bundle-branch block: Secondary | ICD-10-CM | POA: Insufficient documentation

## 2016-02-15 DIAGNOSIS — N390 Urinary tract infection, site not specified: Secondary | ICD-10-CM | POA: Insufficient documentation

## 2017-02-26 HISTORY — PX: TRIGEMINAL NERVE DECOMPRESSION: SHX2579

## 2017-07-30 ENCOUNTER — Emergency Department
Admission: EM | Admit: 2017-07-30 | Discharge: 2017-07-30 | Disposition: A | Discharge status: Home / Self Care | Payer: No Typology Code available for payment source | Attending: Emergency Medicine | Admitting: Emergency Medicine

## 2017-07-30 ENCOUNTER — Emergency Department: Payer: No Typology Code available for payment source

## 2017-07-30 ENCOUNTER — Other Ambulatory Visit: Payer: Self-pay

## 2017-07-30 DIAGNOSIS — S43005A Unspecified dislocation of left shoulder joint, initial encounter: Secondary | ICD-10-CM

## 2017-07-30 DIAGNOSIS — Y92003 Bedroom of unspecified non-institutional (private) residence as the place of occurrence of the external cause: Secondary | ICD-10-CM | POA: Insufficient documentation

## 2017-07-30 DIAGNOSIS — Y998 Other external cause status: Secondary | ICD-10-CM | POA: Insufficient documentation

## 2017-07-30 DIAGNOSIS — M25512 Pain in left shoulder: Secondary | ICD-10-CM | POA: Insufficient documentation

## 2017-07-30 DIAGNOSIS — E039 Hypothyroidism, unspecified: Secondary | ICD-10-CM | POA: Diagnosis not present

## 2017-07-30 DIAGNOSIS — Z87891 Personal history of nicotine dependence: Secondary | ICD-10-CM | POA: Insufficient documentation

## 2017-07-30 DIAGNOSIS — S43015A Anterior dislocation of left humerus, initial encounter: Secondary | ICD-10-CM | POA: Insufficient documentation

## 2017-07-30 DIAGNOSIS — Z9049 Acquired absence of other specified parts of digestive tract: Secondary | ICD-10-CM | POA: Insufficient documentation

## 2017-07-30 DIAGNOSIS — S4992XA Unspecified injury of left shoulder and upper arm, initial encounter: Secondary | ICD-10-CM | POA: Diagnosis present

## 2017-07-30 DIAGNOSIS — W06XXXA Fall from bed, initial encounter: Secondary | ICD-10-CM | POA: Insufficient documentation

## 2017-07-30 DIAGNOSIS — Y9389 Activity, other specified: Secondary | ICD-10-CM | POA: Insufficient documentation

## 2017-07-30 HISTORY — DX: Supraventricular tachycardia, unspecified: I47.10

## 2017-07-30 HISTORY — DX: Rheumatoid arthritis, unspecified: M06.9

## 2017-07-30 HISTORY — DX: Supraventricular tachycardia: I47.1

## 2017-07-30 MED ORDER — ONDANSETRON HCL 4 MG/2ML IJ SOLN
4.0000 mg | Freq: Once | INTRAMUSCULAR | Status: AC
  Administered 2017-07-30: 4 mg via INTRAVENOUS

## 2017-07-30 MED ORDER — HYDROMORPHONE HCL 1 MG/ML IJ SOLN
1.0000 mg | Freq: Once | INTRAMUSCULAR | Status: AC
  Administered 2017-07-30: 1 mg via INTRAVENOUS

## 2017-07-30 MED ORDER — OXYCODONE-ACETAMINOPHEN 5-325 MG PO TABS: 1.0000 | ORAL_TABLET | ORAL | 0 refills | Status: AC | PRN

## 2017-07-30 MED ORDER — KETAMINE HCL 10 MG/ML IJ SOLN
1.0000 mg/kg | Freq: Once | INTRAMUSCULAR | Status: AC
  Administered 2017-07-30: 95 mg via INTRAVENOUS
  Filled 2017-07-30: qty 1

## 2017-07-30 MED ORDER — OXYCODONE-ACETAMINOPHEN 5-325 MG PO TABS
1.0000 | ORAL_TABLET | Freq: Once | ORAL | Status: AC
  Administered 2017-07-30: 1 via ORAL
  Filled 2017-07-30: qty 1

## 2017-07-30 NOTE — ED Triage Notes (Addendum)
Pt arrives to ED via [facility] from home s/p fall with LEFT shoulder injury and possible dislocation. Pt unsure of LOC prior to fall or after, or what events lead up to the fall. Pt reports she was getting up to the bathroom and woke up on the floor. Pt arrives with shoulder sling in place; given 100mcg Fentanyl IV en route. Dr Manson PasseyBrown at bedside upon pt's arrival to ED.

## 2017-07-30 NOTE — ED Notes (Addendum)
Pt's O2 sats noted to drop to 90-92% on RA; pt placed on 2L via [place] with immediate improvement to 96%.

## 2017-07-30 NOTE — ED Notes (Signed)
Supplemental O2 via [place] removed at this time to assess pt's ability to maintain O2 sats independently.

## 2017-07-30 NOTE — ED Provider Notes (Signed)
[facility] Emergency Department Provider Note    First MD Initiated Contact with Patient 07/30/17 [phone]     (approximate)  I have reviewed the triage vital signs and the nursing notes.   HISTORY  Chief Complaint Fall and Shoulder Injury    HPI Tina Welch is a 62 y.o. female list of chronic medical conditions presents to the emergency department via EMS status post accidental fall at home.  Patient states that she arose from bed to go to the bathroom and subsequently fell striking her left shoulder.  Patient admits to 10 out of 10 left shoulder pain.  EMS reports noted deformity to the left shoulder.  Patient denies any head injury no loss of consciousness.   Past Medical History:  Diagnosis Date  . A-fib (HCC)   . Hypothyroid   . Rheumatoid arthritis (HCC)   . SVT (supraventricular tachycardia) (HCC)     There are no active problems to display for this patient.   Past Surgical History:  Procedure Laterality Date  . ABDOMINAL HYSTERECTOMY    . CHOLECYSTECTOMY      Prior to Admission medications   Not on File    Allergies Valium [diazepam] and Morphine and related  No family history on file.  Social History Social History   Tobacco Use  . Smoking status: Former Games developer  . Smokeless tobacco: Never Used  Substance Use Topics  . Alcohol use: Not on file  . Drug use: Not on file    Review of Systems Constitutional: No fever/chills Eyes: No visual changes. ENT: No sore throat. Cardiovascular: Denies chest pain. Respiratory: Denies shortness of breath. Gastrointestinal: No abdominal pain.  No nausea, no vomiting.  No diarrhea.  No constipation. Genitourinary: Negative for dysuria. Musculoskeletal: Negative for neck pain.  Negative for back pain.  Positive for left shoulder pain Integumentary: Negative for rash. Neurological: Negative for headaches, focal weakness or  numbness.   ____________________________________________   PHYSICAL EXAM:  VITAL SIGNS: ED Triage Vitals  Enc Vitals Group     BP 07/30/17 0124 105/66     Pulse Rate 07/30/17 0124 (!) 57     Resp 07/30/17 0124 20     Temp 07/30/17 0124 97.6 F (36.4 C)     Temp Source 07/30/17 0124 Oral     SpO2 07/30/17 0124 94 %     Weight 07/30/17 0126 95.3 kg (210 lb)     Height 07/30/17 0126 1.702 m (5\' 7" )     Head Circumference --      Peak Flow --      Pain Score 07/30/17 0126 10     Pain Loc --      Pain Edu? --      Excl. in GC? --    Constitutional: Alert and oriented.  Apparent discomfort Head: Atraumatic. Mouth/Throat: Mucous membranes are moist.  Oropharynx non-erythematous. Neck: No stridor.  Cardiovascular: Normal rate, regular rhythm. Good peripheral circulation. Grossly normal heart sounds. Respiratory: Normal respiratory effort.  No retractions. Lungs CTAB. Gastrointestinal: Soft and nontender. No distention.   Musculoskeletal: Gross deformity of the left shoulder noted equal palpable distal pulses sensation intact. Neurologic:  Normal speech and language. No gross focal neurologic deficits are appreciated.  Skin:  Skin is warm, dry and intact. No rash noted. Psychiatric: Mood and affect are normal. Speech and behavior are normal.   RADIOLOGY I, [place] N Donjuan Robison, personally viewed and evaluated these images (plain radiographs) as part of my medical decision making, as  well as reviewing the written report by the radiologist.  ED MD interpretation: Anterior shoulder dislocation noted on initial x-ray of the left shoulder.  With possible fracture of the glenoid area Repeat x-ray revealed shoulder reduction successful  Official radiology report(s): Dg Shoulder Left  Result Date: 07/30/2017 CLINICAL DATA:  Left shoulder dislocation, postreduction. EXAM: LEFT SHOULDER - 2+ VIEW COMPARISON:  Pre reduction radiographs earlier this day. FINDINGS: Prior anterior shoulder  dislocation has been reduced, normal glenohumeral alignment. Minimal irregularity again seen about the inferior glenoid. Limited assessment for left sacs impaction injury on provided views. Acromioclavicular joint is congruent. IMPRESSION: Reduction of prior shoulder dislocation. Minimal inferior glenoid irregularity persists, may reflect a nondisplaced fracture. Electronically Signed   By: Rubye OaksMelanie  Ehinger M.D.   On: 07/30/2017 02:31   Dg Shoulder Left  Result Date: 07/30/2017 CLINICAL DATA:  Left shoulder pain after fall, possible dislocation. EXAM: LEFT SHOULDER - 2+ VIEW COMPARISON:  None. FINDINGS: Anterior dislocation of the humeral head with respect to the glenoid. Minimal irregularity about the inferior glenoid may be nondisplaced fracture. The acromioclavicular joint is congruent. IMPRESSION: Anterior shoulder dislocation. Minimal irregularity about the inferior glenoid may be bony [place] fracture. Electronically Signed   By: Rubye OaksMelanie  Ehinger M.D.   On: 07/30/2017 01:44      Procedure(s) performed:   .Sedation Date/Time: 07/30/2017 2:36 AM Performed by: Darci CurrentBrown, Donahue N, MD Authorized by: Darci CurrentBrown, Millhousen N, MD   Consent:    Consent obtained:  Written (electronic informed consent)   Risks discussed:  Allergic reaction, dysrhythmia, inadequate sedation, nausea, vomiting, respiratory compromise necessitating ventilatory assistance and intubation, prolonged sedation necessitating reversal and prolonged hypoxia resulting in organ damage Universal protocol:    Procedure explained and questions answered to patient or proxy's satisfaction: yes     Relevant documents present and verified: yes     Test results available and properly labeled: yes     Imaging studies available: yes     Required blood products, implants, devices, and special equipment available: yes     Immediately prior to procedure a time out was called: yes     Patient identity confirmation method:  Arm band Indications:     Intended level of sedation:  Moderate (conscious sedation) Pre-sedation assessment:    Time since last food or drink:  5hours   ASA classification: class 2 - patient with mild systemic disease     Mallampati score:  I - soft palate, uvula, fauces, pillars visible   Pre-sedation assessments completed and reviewed: airway patency, cardiovascular function, mental status, nausea/vomiting and pain level   Immediate pre-procedure details:    Reassessment: Patient reassessed immediately prior to procedure     Reviewed: vital signs, relevant labs/tests and NPO status     Verified: bag valve mask available, emergency equipment available, intubation equipment available, IV patency confirmed, oxygen available, reversal medications available and suction available   Procedure details (see MAR for exact dosages):    Preoxygenation:  Room air   Intra-procedure monitoring:  Blood pressure monitoring, continuous pulse oximetry, cardiac monitor, frequent vital sign checks and frequent LOC assessments   Total Provider sedation time (minutes):  40 Post-procedure details:    Attendance: Constant attendance by certified staff until patient recovered     Recovery: Patient returned to pre-procedure baseline     Post-sedation assessments completed and reviewed: airway patency, cardiovascular function, hydration status, mental status and respiratory function     Patient is stable for discharge or admission: yes  Patient tolerance:  Tolerated well, no immediate complications  Reduction of dislocation Date/Time: 07/30/2017 4:08 AM Performed by: Darci Current, MD Authorized by: Darci Current, MD  Consent: Verbal consent obtained. Consent given by: patient Patient understanding: patient states understanding of the procedure being performed Patient identity confirmed: verbally with patient and arm band Local anesthesia used: no  Anesthesia: Local anesthesia used: no  Sedation: Patient sedated:  yes Analgesia: ketamine Vitals: Vital signs were monitored during sedation.  Patient tolerance: Patient tolerated the procedure well with no immediate complications      ____________________________________________   INITIAL IMPRESSION / ASSESSMENT AND PLAN / ED COURSE  As part of my medical decision making, I reviewed the following data within the electronic MEDICAL RECORD NUMBER   62 year old female presenting with above-stated history and physical exam secondary to accidental fall with resultant left shoulder dislocation.  Patient's shoulder was reduced without difficulty conscious sedation was utilized.  Patient alert oriented at this time with no complaints.  Patient be referred to orthopedic surgery who she is to be referred to [facility]. ____________________________________________  FINAL CLINICAL IMPRESSION(S) / ED DIAGNOSES  Final diagnoses:  Closed dislocation of left shoulder, initial encounter     MEDICATIONS GIVEN DURING THIS VISIT:  Medications  ondansetron (ZOFRAN) injection 4 mg (4 mg Intravenous Given 07/30/17 0130)  HYDROmorphone (DILAUDID) injection 1 mg (1 mg Intravenous Given 07/30/17 0130)  ketamine (KETALAR) injection 95 mg (95 mg Intravenous Given 07/30/17 0159)     ED Discharge Orders    None       Note:  This document was prepared using Dragon voice recognition software and may include unintentional dictation errors.    Darci Current, MD 07/30/17 [phone]

## 2017-08-28 DIAGNOSIS — S46012A Strain of muscle(s) and tendon(s) of the rotator cuff of left shoulder, initial encounter: Secondary | ICD-10-CM | POA: Insufficient documentation

## 2017-09-27 DIAGNOSIS — R6889 Other general symptoms and signs: Secondary | ICD-10-CM | POA: Insufficient documentation

## 2017-10-07 DIAGNOSIS — Z4789 Encounter for other orthopedic aftercare: Secondary | ICD-10-CM | POA: Insufficient documentation

## 2018-07-02 DIAGNOSIS — I10 Essential (primary) hypertension: Secondary | ICD-10-CM | POA: Diagnosis not present

## 2018-07-02 DIAGNOSIS — E669 Obesity, unspecified: Secondary | ICD-10-CM | POA: Diagnosis not present

## 2018-07-02 DIAGNOSIS — I471 Supraventricular tachycardia: Secondary | ICD-10-CM | POA: Diagnosis not present

## 2018-07-02 DIAGNOSIS — G5132 Clonic hemifacial spasm, left: Secondary | ICD-10-CM | POA: Diagnosis not present

## 2018-07-02 DIAGNOSIS — E890 Postprocedural hypothyroidism: Secondary | ICD-10-CM | POA: Diagnosis not present

## 2018-07-02 DIAGNOSIS — K219 Gastro-esophageal reflux disease without esophagitis: Secondary | ICD-10-CM | POA: Diagnosis not present

## 2018-07-02 DIAGNOSIS — R7301 Impaired fasting glucose: Secondary | ICD-10-CM | POA: Diagnosis not present

## 2018-07-03 DIAGNOSIS — G5132 Clonic hemifacial spasm, left: Secondary | ICD-10-CM | POA: Diagnosis not present

## 2018-09-12 DIAGNOSIS — R21 Rash and other nonspecific skin eruption: Secondary | ICD-10-CM | POA: Diagnosis not present

## 2018-10-23 DIAGNOSIS — E0500 Thyrotoxicosis with diffuse goiter without thyrotoxic crisis or storm: Secondary | ICD-10-CM | POA: Diagnosis not present

## 2018-10-23 DIAGNOSIS — H1852 Epithelial (juvenile) corneal dystrophy: Secondary | ICD-10-CM | POA: Diagnosis not present

## 2018-10-23 DIAGNOSIS — Z961 Presence of intraocular lens: Secondary | ICD-10-CM | POA: Diagnosis not present

## 2018-11-20 DIAGNOSIS — G4730 Sleep apnea, unspecified: Secondary | ICD-10-CM | POA: Insufficient documentation

## 2019-02-16 DIAGNOSIS — E890 Postprocedural hypothyroidism: Secondary | ICD-10-CM | POA: Diagnosis not present

## 2019-02-16 DIAGNOSIS — D3501 Benign neoplasm of right adrenal gland: Secondary | ICD-10-CM | POA: Diagnosis not present

## 2019-02-16 DIAGNOSIS — E0500 Thyrotoxicosis with diffuse goiter without thyrotoxic crisis or storm: Secondary | ICD-10-CM | POA: Diagnosis not present

## 2019-04-15 DIAGNOSIS — Z23 Encounter for immunization: Secondary | ICD-10-CM | POA: Diagnosis not present

## 2019-04-15 DIAGNOSIS — Z9181 History of falling: Secondary | ICD-10-CM | POA: Diagnosis not present

## 2019-04-15 DIAGNOSIS — I10 Essential (primary) hypertension: Secondary | ICD-10-CM | POA: Diagnosis not present

## 2019-04-15 DIAGNOSIS — E890 Postprocedural hypothyroidism: Secondary | ICD-10-CM | POA: Diagnosis not present

## 2019-04-15 DIAGNOSIS — I471 Supraventricular tachycardia: Secondary | ICD-10-CM | POA: Diagnosis not present

## 2019-04-15 DIAGNOSIS — K219 Gastro-esophageal reflux disease without esophagitis: Secondary | ICD-10-CM | POA: Diagnosis not present

## 2019-04-15 DIAGNOSIS — Z0000 Encounter for general adult medical examination without abnormal findings: Secondary | ICD-10-CM | POA: Diagnosis not present

## 2019-04-24 DIAGNOSIS — G5133 Clonic hemifacial spasm, bilateral: Secondary | ICD-10-CM | POA: Diagnosis not present

## 2019-04-24 DIAGNOSIS — G5132 Clonic hemifacial spasm, left: Secondary | ICD-10-CM | POA: Diagnosis not present

## 2019-04-24 DIAGNOSIS — I471 Supraventricular tachycardia: Secondary | ICD-10-CM | POA: Diagnosis not present

## 2019-07-24 DIAGNOSIS — G5133 Clonic hemifacial spasm, bilateral: Secondary | ICD-10-CM | POA: Diagnosis not present

## 2019-07-24 DIAGNOSIS — G245 Blepharospasm: Secondary | ICD-10-CM | POA: Diagnosis not present

## 2019-11-19 DIAGNOSIS — I10 Essential (primary) hypertension: Secondary | ICD-10-CM | POA: Diagnosis not present

## 2019-11-19 DIAGNOSIS — R7301 Impaired fasting glucose: Secondary | ICD-10-CM | POA: Diagnosis not present

## 2019-11-19 DIAGNOSIS — I471 Supraventricular tachycardia: Secondary | ICD-10-CM | POA: Diagnosis not present

## 2019-11-19 DIAGNOSIS — E890 Postprocedural hypothyroidism: Secondary | ICD-10-CM | POA: Diagnosis not present

## 2019-11-19 DIAGNOSIS — H6121 Impacted cerumen, right ear: Secondary | ICD-10-CM | POA: Diagnosis not present

## 2019-11-19 DIAGNOSIS — K219 Gastro-esophageal reflux disease without esophagitis: Secondary | ICD-10-CM | POA: Diagnosis not present

## 2019-11-20 DIAGNOSIS — G5133 Clonic hemifacial spasm, bilateral: Secondary | ICD-10-CM | POA: Diagnosis not present

## 2019-11-20 DIAGNOSIS — G245 Blepharospasm: Secondary | ICD-10-CM | POA: Diagnosis not present

## 2020-01-23 DIAGNOSIS — J00 Acute nasopharyngitis [common cold]: Secondary | ICD-10-CM | POA: Diagnosis not present

## 2020-01-23 DIAGNOSIS — Z20822 Contact with and (suspected) exposure to covid-19: Secondary | ICD-10-CM | POA: Diagnosis not present

## 2020-02-09 DIAGNOSIS — D3501 Benign neoplasm of right adrenal gland: Secondary | ICD-10-CM | POA: Diagnosis not present

## 2020-02-09 DIAGNOSIS — E890 Postprocedural hypothyroidism: Secondary | ICD-10-CM | POA: Diagnosis not present

## 2020-02-24 DIAGNOSIS — Z23 Encounter for immunization: Secondary | ICD-10-CM | POA: Diagnosis not present

## 2020-02-28 DIAGNOSIS — Z20822 Contact with and (suspected) exposure to covid-19: Secondary | ICD-10-CM | POA: Diagnosis not present

## 2020-02-28 DIAGNOSIS — U071 COVID-19: Secondary | ICD-10-CM | POA: Diagnosis not present

## 2020-03-15 ENCOUNTER — Telehealth: Payer: Self-pay | Admitting: Gastroenterology

## 2020-03-15 NOTE — Telephone Encounter (Signed)
Spoke to patient requesting an appointment to discuss colonoscopy. Patient is due but has not been see in several years. Appointment scheduled.

## 2020-03-15 NOTE — Telephone Encounter (Signed)
Pt wants to know when she is due for a repeat colon. She stated that her last one was with Dr. Chales Abrahams in [place].

## 2020-03-25 DIAGNOSIS — Z9889 Other specified postprocedural states: Secondary | ICD-10-CM | POA: Diagnosis not present

## 2020-03-25 DIAGNOSIS — G245 Blepharospasm: Secondary | ICD-10-CM | POA: Diagnosis not present

## 2020-03-25 DIAGNOSIS — G5133 Clonic hemifacial spasm, bilateral: Secondary | ICD-10-CM | POA: Diagnosis not present

## 2020-03-29 DIAGNOSIS — I10 Essential (primary) hypertension: Secondary | ICD-10-CM | POA: Insufficient documentation

## 2020-03-29 DIAGNOSIS — I471 Supraventricular tachycardia: Secondary | ICD-10-CM | POA: Diagnosis not present

## 2020-04-01 DIAGNOSIS — Z0000 Encounter for general adult medical examination without abnormal findings: Secondary | ICD-10-CM | POA: Diagnosis not present

## 2020-04-01 DIAGNOSIS — Z23 Encounter for immunization: Secondary | ICD-10-CM | POA: Diagnosis not present

## 2020-04-06 ENCOUNTER — Ambulatory Visit: Payer: No Typology Code available for payment source | Admitting: Gastroenterology

## 2020-07-22 DIAGNOSIS — G5133 Clonic hemifacial spasm, bilateral: Secondary | ICD-10-CM | POA: Diagnosis not present

## 2020-10-21 DIAGNOSIS — G5133 Clonic hemifacial spasm, bilateral: Secondary | ICD-10-CM | POA: Diagnosis not present

## 2020-10-21 DIAGNOSIS — G245 Blepharospasm: Secondary | ICD-10-CM | POA: Diagnosis not present

## 2020-12-15 DIAGNOSIS — R079 Chest pain, unspecified: Secondary | ICD-10-CM | POA: Diagnosis not present

## 2020-12-15 DIAGNOSIS — I471 Supraventricular tachycardia: Secondary | ICD-10-CM | POA: Diagnosis not present

## 2020-12-15 DIAGNOSIS — I10 Essential (primary) hypertension: Secondary | ICD-10-CM | POA: Diagnosis not present

## 2020-12-16 DIAGNOSIS — I10 Essential (primary) hypertension: Secondary | ICD-10-CM | POA: Diagnosis not present

## 2021-02-16 DIAGNOSIS — M1712 Unilateral primary osteoarthritis, left knee: Secondary | ICD-10-CM | POA: Diagnosis not present

## 2021-02-16 DIAGNOSIS — I10 Essential (primary) hypertension: Secondary | ICD-10-CM | POA: Diagnosis not present

## 2021-02-16 DIAGNOSIS — E890 Postprocedural hypothyroidism: Secondary | ICD-10-CM | POA: Diagnosis not present

## 2021-02-16 DIAGNOSIS — L409 Psoriasis, unspecified: Secondary | ICD-10-CM | POA: Insufficient documentation

## 2021-02-16 DIAGNOSIS — D3501 Benign neoplasm of right adrenal gland: Secondary | ICD-10-CM | POA: Diagnosis not present

## 2021-02-16 DIAGNOSIS — M0520 Rheumatoid vasculitis with rheumatoid arthritis of unspecified site: Secondary | ICD-10-CM | POA: Insufficient documentation

## 2021-02-16 DIAGNOSIS — M25562 Pain in left knee: Secondary | ICD-10-CM | POA: Diagnosis not present

## 2021-02-16 DIAGNOSIS — L4050 Arthropathic psoriasis, unspecified: Secondary | ICD-10-CM | POA: Insufficient documentation

## 2021-03-24 DIAGNOSIS — G245 Blepharospasm: Secondary | ICD-10-CM | POA: Diagnosis not present

## 2021-03-24 DIAGNOSIS — G5132 Clonic hemifacial spasm, left: Secondary | ICD-10-CM | POA: Diagnosis not present

## 2021-03-24 DIAGNOSIS — G5133 Clonic hemifacial spasm, bilateral: Secondary | ICD-10-CM | POA: Diagnosis not present

## 2021-03-29 HISTORY — PX: REPLACEMENT TOTAL KNEE: SUR1224

## 2021-04-03 DIAGNOSIS — G4733 Obstructive sleep apnea (adult) (pediatric): Secondary | ICD-10-CM | POA: Diagnosis not present

## 2021-04-03 DIAGNOSIS — M1712 Unilateral primary osteoarthritis, left knee: Secondary | ICD-10-CM | POA: Diagnosis not present

## 2021-04-03 DIAGNOSIS — E669 Obesity, unspecified: Secondary | ICD-10-CM | POA: Insufficient documentation

## 2021-04-03 DIAGNOSIS — Z01818 Encounter for other preprocedural examination: Secondary | ICD-10-CM | POA: Diagnosis not present

## 2021-04-03 DIAGNOSIS — M25562 Pain in left knee: Secondary | ICD-10-CM | POA: Diagnosis not present

## 2021-04-03 DIAGNOSIS — Z01812 Encounter for preprocedural laboratory examination: Secondary | ICD-10-CM | POA: Diagnosis not present

## 2021-04-03 DIAGNOSIS — I10 Essential (primary) hypertension: Secondary | ICD-10-CM | POA: Diagnosis not present

## 2021-04-03 DIAGNOSIS — E66812 Obesity, class 2: Secondary | ICD-10-CM | POA: Insufficient documentation

## 2021-04-03 DIAGNOSIS — Z01810 Encounter for preprocedural cardiovascular examination: Secondary | ICD-10-CM | POA: Diagnosis not present

## 2021-04-14 DIAGNOSIS — K760 Fatty (change of) liver, not elsewhere classified: Secondary | ICD-10-CM | POA: Diagnosis not present

## 2021-04-14 DIAGNOSIS — F419 Anxiety disorder, unspecified: Secondary | ICD-10-CM | POA: Diagnosis not present

## 2021-04-14 DIAGNOSIS — Z6836 Body mass index (BMI) 36.0-36.9, adult: Secondary | ICD-10-CM | POA: Diagnosis not present

## 2021-04-14 DIAGNOSIS — M1712 Unilateral primary osteoarthritis, left knee: Secondary | ICD-10-CM | POA: Diagnosis not present

## 2021-04-14 DIAGNOSIS — E039 Hypothyroidism, unspecified: Secondary | ICD-10-CM | POA: Diagnosis not present

## 2021-04-14 DIAGNOSIS — E669 Obesity, unspecified: Secondary | ICD-10-CM | POA: Diagnosis not present

## 2021-04-14 DIAGNOSIS — I471 Supraventricular tachycardia: Secondary | ICD-10-CM | POA: Diagnosis not present

## 2021-04-14 DIAGNOSIS — Z8616 Personal history of COVID-19: Secondary | ICD-10-CM | POA: Diagnosis not present

## 2021-04-14 DIAGNOSIS — I4510 Unspecified right bundle-branch block: Secondary | ICD-10-CM | POA: Diagnosis not present

## 2021-04-14 DIAGNOSIS — G4733 Obstructive sleep apnea (adult) (pediatric): Secondary | ICD-10-CM | POA: Diagnosis not present

## 2021-04-14 DIAGNOSIS — E0590 Thyrotoxicosis, unspecified without thyrotoxic crisis or storm: Secondary | ICD-10-CM | POA: Diagnosis not present

## 2021-04-14 DIAGNOSIS — I340 Nonrheumatic mitral (valve) insufficiency: Secondary | ICD-10-CM | POA: Diagnosis not present

## 2021-04-14 DIAGNOSIS — K219 Gastro-esophageal reflux disease without esophagitis: Secondary | ICD-10-CM | POA: Diagnosis not present

## 2021-04-14 DIAGNOSIS — I10 Essential (primary) hypertension: Secondary | ICD-10-CM | POA: Diagnosis not present

## 2021-04-14 DIAGNOSIS — M25562 Pain in left knee: Secondary | ICD-10-CM | POA: Diagnosis not present

## 2021-04-14 DIAGNOSIS — F32A Depression, unspecified: Secondary | ICD-10-CM | POA: Diagnosis not present

## 2021-04-14 DIAGNOSIS — Z885 Allergy status to narcotic agent status: Secondary | ICD-10-CM | POA: Diagnosis not present

## 2021-04-14 DIAGNOSIS — G8918 Other acute postprocedural pain: Secondary | ICD-10-CM | POA: Diagnosis not present

## 2021-04-15 DIAGNOSIS — E669 Obesity, unspecified: Secondary | ICD-10-CM | POA: Diagnosis not present

## 2021-04-15 DIAGNOSIS — K219 Gastro-esophageal reflux disease without esophagitis: Secondary | ICD-10-CM | POA: Diagnosis not present

## 2021-04-15 DIAGNOSIS — I10 Essential (primary) hypertension: Secondary | ICD-10-CM | POA: Diagnosis not present

## 2021-04-15 DIAGNOSIS — E039 Hypothyroidism, unspecified: Secondary | ICD-10-CM | POA: Diagnosis not present

## 2021-04-15 DIAGNOSIS — F32A Depression, unspecified: Secondary | ICD-10-CM | POA: Diagnosis not present

## 2021-04-15 DIAGNOSIS — M1712 Unilateral primary osteoarthritis, left knee: Secondary | ICD-10-CM | POA: Diagnosis not present

## 2021-04-15 DIAGNOSIS — Z6836 Body mass index (BMI) 36.0-36.9, adult: Secondary | ICD-10-CM | POA: Diagnosis not present

## 2021-04-15 DIAGNOSIS — E0590 Thyrotoxicosis, unspecified without thyrotoxic crisis or storm: Secondary | ICD-10-CM | POA: Diagnosis not present

## 2021-04-15 DIAGNOSIS — I471 Supraventricular tachycardia: Secondary | ICD-10-CM | POA: Diagnosis not present

## 2021-04-15 DIAGNOSIS — G4733 Obstructive sleep apnea (adult) (pediatric): Secondary | ICD-10-CM | POA: Diagnosis not present

## 2021-04-15 DIAGNOSIS — Z8616 Personal history of COVID-19: Secondary | ICD-10-CM | POA: Diagnosis not present

## 2021-04-15 DIAGNOSIS — Z885 Allergy status to narcotic agent status: Secondary | ICD-10-CM | POA: Diagnosis not present

## 2021-04-15 DIAGNOSIS — I340 Nonrheumatic mitral (valve) insufficiency: Secondary | ICD-10-CM | POA: Diagnosis not present

## 2021-04-15 DIAGNOSIS — F419 Anxiety disorder, unspecified: Secondary | ICD-10-CM | POA: Diagnosis not present

## 2021-04-15 DIAGNOSIS — I4510 Unspecified right bundle-branch block: Secondary | ICD-10-CM | POA: Diagnosis not present

## 2021-04-15 DIAGNOSIS — K760 Fatty (change of) liver, not elsewhere classified: Secondary | ICD-10-CM | POA: Diagnosis not present

## 2021-04-17 DIAGNOSIS — Z471 Aftercare following joint replacement surgery: Secondary | ICD-10-CM | POA: Diagnosis not present

## 2021-04-17 DIAGNOSIS — E669 Obesity, unspecified: Secondary | ICD-10-CM | POA: Diagnosis not present

## 2021-04-17 DIAGNOSIS — I4510 Unspecified right bundle-branch block: Secondary | ICD-10-CM | POA: Diagnosis not present

## 2021-04-17 DIAGNOSIS — I471 Supraventricular tachycardia: Secondary | ICD-10-CM | POA: Diagnosis not present

## 2021-04-17 DIAGNOSIS — G4733 Obstructive sleep apnea (adult) (pediatric): Secondary | ICD-10-CM | POA: Diagnosis not present

## 2021-04-17 DIAGNOSIS — F32A Depression, unspecified: Secondary | ICD-10-CM | POA: Diagnosis not present

## 2021-04-17 DIAGNOSIS — K219 Gastro-esophageal reflux disease without esophagitis: Secondary | ICD-10-CM | POA: Diagnosis not present

## 2021-04-17 DIAGNOSIS — M069 Rheumatoid arthritis, unspecified: Secondary | ICD-10-CM | POA: Diagnosis not present

## 2021-04-17 DIAGNOSIS — F419 Anxiety disorder, unspecified: Secondary | ICD-10-CM | POA: Diagnosis not present

## 2021-04-17 DIAGNOSIS — K760 Fatty (change of) liver, not elsewhere classified: Secondary | ICD-10-CM | POA: Diagnosis not present

## 2021-04-17 DIAGNOSIS — Z87440 Personal history of urinary (tract) infections: Secondary | ICD-10-CM | POA: Diagnosis not present

## 2021-04-17 DIAGNOSIS — M5136 Other intervertebral disc degeneration, lumbar region: Secondary | ICD-10-CM | POA: Diagnosis not present

## 2021-04-17 DIAGNOSIS — Z6835 Body mass index (BMI) 35.0-35.9, adult: Secondary | ICD-10-CM | POA: Diagnosis not present

## 2021-04-17 DIAGNOSIS — I10 Essential (primary) hypertension: Secondary | ICD-10-CM | POA: Diagnosis not present

## 2021-04-17 DIAGNOSIS — Z96652 Presence of left artificial knee joint: Secondary | ICD-10-CM | POA: Diagnosis not present

## 2021-04-17 DIAGNOSIS — Z87891 Personal history of nicotine dependence: Secondary | ICD-10-CM | POA: Diagnosis not present

## 2021-04-19 DIAGNOSIS — K219 Gastro-esophageal reflux disease without esophagitis: Secondary | ICD-10-CM | POA: Diagnosis not present

## 2021-04-19 DIAGNOSIS — I471 Supraventricular tachycardia: Secondary | ICD-10-CM | POA: Diagnosis not present

## 2021-04-19 DIAGNOSIS — E669 Obesity, unspecified: Secondary | ICD-10-CM | POA: Diagnosis not present

## 2021-04-19 DIAGNOSIS — M5136 Other intervertebral disc degeneration, lumbar region: Secondary | ICD-10-CM | POA: Diagnosis not present

## 2021-04-19 DIAGNOSIS — K760 Fatty (change of) liver, not elsewhere classified: Secondary | ICD-10-CM | POA: Diagnosis not present

## 2021-04-19 DIAGNOSIS — F32A Depression, unspecified: Secondary | ICD-10-CM | POA: Diagnosis not present

## 2021-04-19 DIAGNOSIS — F419 Anxiety disorder, unspecified: Secondary | ICD-10-CM | POA: Diagnosis not present

## 2021-04-19 DIAGNOSIS — M069 Rheumatoid arthritis, unspecified: Secondary | ICD-10-CM | POA: Diagnosis not present

## 2021-04-19 DIAGNOSIS — Z471 Aftercare following joint replacement surgery: Secondary | ICD-10-CM | POA: Diagnosis not present

## 2021-04-19 DIAGNOSIS — Z6835 Body mass index (BMI) 35.0-35.9, adult: Secondary | ICD-10-CM | POA: Diagnosis not present

## 2021-04-19 DIAGNOSIS — I10 Essential (primary) hypertension: Secondary | ICD-10-CM | POA: Diagnosis not present

## 2021-04-19 DIAGNOSIS — Z96652 Presence of left artificial knee joint: Secondary | ICD-10-CM | POA: Diagnosis not present

## 2021-04-19 DIAGNOSIS — Z87440 Personal history of urinary (tract) infections: Secondary | ICD-10-CM | POA: Diagnosis not present

## 2021-04-19 DIAGNOSIS — Z87891 Personal history of nicotine dependence: Secondary | ICD-10-CM | POA: Diagnosis not present

## 2021-04-19 DIAGNOSIS — I4510 Unspecified right bundle-branch block: Secondary | ICD-10-CM | POA: Diagnosis not present

## 2021-04-19 DIAGNOSIS — G4733 Obstructive sleep apnea (adult) (pediatric): Secondary | ICD-10-CM | POA: Diagnosis not present

## 2021-04-21 DIAGNOSIS — F419 Anxiety disorder, unspecified: Secondary | ICD-10-CM | POA: Diagnosis not present

## 2021-04-21 DIAGNOSIS — Z96652 Presence of left artificial knee joint: Secondary | ICD-10-CM | POA: Diagnosis not present

## 2021-04-21 DIAGNOSIS — K760 Fatty (change of) liver, not elsewhere classified: Secondary | ICD-10-CM | POA: Diagnosis not present

## 2021-04-21 DIAGNOSIS — Z87891 Personal history of nicotine dependence: Secondary | ICD-10-CM | POA: Diagnosis not present

## 2021-04-21 DIAGNOSIS — Z6835 Body mass index (BMI) 35.0-35.9, adult: Secondary | ICD-10-CM | POA: Diagnosis not present

## 2021-04-21 DIAGNOSIS — I4510 Unspecified right bundle-branch block: Secondary | ICD-10-CM | POA: Diagnosis not present

## 2021-04-21 DIAGNOSIS — E669 Obesity, unspecified: Secondary | ICD-10-CM | POA: Diagnosis not present

## 2021-04-21 DIAGNOSIS — F32A Depression, unspecified: Secondary | ICD-10-CM | POA: Diagnosis not present

## 2021-04-21 DIAGNOSIS — K219 Gastro-esophageal reflux disease without esophagitis: Secondary | ICD-10-CM | POA: Diagnosis not present

## 2021-04-21 DIAGNOSIS — Z471 Aftercare following joint replacement surgery: Secondary | ICD-10-CM | POA: Diagnosis not present

## 2021-04-21 DIAGNOSIS — I471 Supraventricular tachycardia: Secondary | ICD-10-CM | POA: Diagnosis not present

## 2021-04-21 DIAGNOSIS — G4733 Obstructive sleep apnea (adult) (pediatric): Secondary | ICD-10-CM | POA: Diagnosis not present

## 2021-04-21 DIAGNOSIS — M5136 Other intervertebral disc degeneration, lumbar region: Secondary | ICD-10-CM | POA: Diagnosis not present

## 2021-04-21 DIAGNOSIS — M069 Rheumatoid arthritis, unspecified: Secondary | ICD-10-CM | POA: Diagnosis not present

## 2021-04-21 DIAGNOSIS — Z87440 Personal history of urinary (tract) infections: Secondary | ICD-10-CM | POA: Diagnosis not present

## 2021-04-21 DIAGNOSIS — I10 Essential (primary) hypertension: Secondary | ICD-10-CM | POA: Diagnosis not present

## 2021-04-24 DIAGNOSIS — K219 Gastro-esophageal reflux disease without esophagitis: Secondary | ICD-10-CM | POA: Diagnosis not present

## 2021-04-24 DIAGNOSIS — Z96652 Presence of left artificial knee joint: Secondary | ICD-10-CM | POA: Diagnosis not present

## 2021-04-24 DIAGNOSIS — Z87891 Personal history of nicotine dependence: Secondary | ICD-10-CM | POA: Diagnosis not present

## 2021-04-24 DIAGNOSIS — M5136 Other intervertebral disc degeneration, lumbar region: Secondary | ICD-10-CM | POA: Diagnosis not present

## 2021-04-24 DIAGNOSIS — G4733 Obstructive sleep apnea (adult) (pediatric): Secondary | ICD-10-CM | POA: Diagnosis not present

## 2021-04-24 DIAGNOSIS — M069 Rheumatoid arthritis, unspecified: Secondary | ICD-10-CM | POA: Diagnosis not present

## 2021-04-24 DIAGNOSIS — K760 Fatty (change of) liver, not elsewhere classified: Secondary | ICD-10-CM | POA: Diagnosis not present

## 2021-04-24 DIAGNOSIS — I471 Supraventricular tachycardia: Secondary | ICD-10-CM | POA: Diagnosis not present

## 2021-04-24 DIAGNOSIS — Z471 Aftercare following joint replacement surgery: Secondary | ICD-10-CM | POA: Diagnosis not present

## 2021-04-24 DIAGNOSIS — I10 Essential (primary) hypertension: Secondary | ICD-10-CM | POA: Diagnosis not present

## 2021-04-24 DIAGNOSIS — I4510 Unspecified right bundle-branch block: Secondary | ICD-10-CM | POA: Diagnosis not present

## 2021-04-24 DIAGNOSIS — Z87440 Personal history of urinary (tract) infections: Secondary | ICD-10-CM | POA: Diagnosis not present

## 2021-04-24 DIAGNOSIS — E669 Obesity, unspecified: Secondary | ICD-10-CM | POA: Diagnosis not present

## 2021-04-24 DIAGNOSIS — F419 Anxiety disorder, unspecified: Secondary | ICD-10-CM | POA: Diagnosis not present

## 2021-04-24 DIAGNOSIS — F32A Depression, unspecified: Secondary | ICD-10-CM | POA: Diagnosis not present

## 2021-04-24 DIAGNOSIS — Z6835 Body mass index (BMI) 35.0-35.9, adult: Secondary | ICD-10-CM | POA: Diagnosis not present

## 2021-04-25 DIAGNOSIS — I471 Supraventricular tachycardia: Secondary | ICD-10-CM | POA: Diagnosis not present

## 2021-04-25 DIAGNOSIS — M1712 Unilateral primary osteoarthritis, left knee: Secondary | ICD-10-CM | POA: Diagnosis not present

## 2021-04-25 DIAGNOSIS — E890 Postprocedural hypothyroidism: Secondary | ICD-10-CM | POA: Diagnosis not present

## 2021-04-25 DIAGNOSIS — K219 Gastro-esophageal reflux disease without esophagitis: Secondary | ICD-10-CM | POA: Diagnosis not present

## 2021-04-26 DIAGNOSIS — E669 Obesity, unspecified: Secondary | ICD-10-CM | POA: Diagnosis not present

## 2021-04-26 DIAGNOSIS — Z471 Aftercare following joint replacement surgery: Secondary | ICD-10-CM | POA: Diagnosis not present

## 2021-04-26 DIAGNOSIS — F32A Depression, unspecified: Secondary | ICD-10-CM | POA: Diagnosis not present

## 2021-04-26 DIAGNOSIS — I10 Essential (primary) hypertension: Secondary | ICD-10-CM | POA: Diagnosis not present

## 2021-04-26 DIAGNOSIS — F419 Anxiety disorder, unspecified: Secondary | ICD-10-CM | POA: Diagnosis not present

## 2021-04-26 DIAGNOSIS — M5136 Other intervertebral disc degeneration, lumbar region: Secondary | ICD-10-CM | POA: Diagnosis not present

## 2021-04-26 DIAGNOSIS — I4510 Unspecified right bundle-branch block: Secondary | ICD-10-CM | POA: Diagnosis not present

## 2021-04-26 DIAGNOSIS — Z87440 Personal history of urinary (tract) infections: Secondary | ICD-10-CM | POA: Diagnosis not present

## 2021-04-26 DIAGNOSIS — M069 Rheumatoid arthritis, unspecified: Secondary | ICD-10-CM | POA: Diagnosis not present

## 2021-04-26 DIAGNOSIS — I471 Supraventricular tachycardia: Secondary | ICD-10-CM | POA: Diagnosis not present

## 2021-04-26 DIAGNOSIS — K760 Fatty (change of) liver, not elsewhere classified: Secondary | ICD-10-CM | POA: Diagnosis not present

## 2021-04-26 DIAGNOSIS — Z87891 Personal history of nicotine dependence: Secondary | ICD-10-CM | POA: Diagnosis not present

## 2021-04-26 DIAGNOSIS — Z6835 Body mass index (BMI) 35.0-35.9, adult: Secondary | ICD-10-CM | POA: Diagnosis not present

## 2021-04-26 DIAGNOSIS — G4733 Obstructive sleep apnea (adult) (pediatric): Secondary | ICD-10-CM | POA: Diagnosis not present

## 2021-04-26 DIAGNOSIS — K219 Gastro-esophageal reflux disease without esophagitis: Secondary | ICD-10-CM | POA: Diagnosis not present

## 2021-04-26 DIAGNOSIS — Z96652 Presence of left artificial knee joint: Secondary | ICD-10-CM | POA: Diagnosis not present

## 2021-04-28 DIAGNOSIS — F32A Depression, unspecified: Secondary | ICD-10-CM | POA: Diagnosis not present

## 2021-04-28 DIAGNOSIS — K760 Fatty (change of) liver, not elsewhere classified: Secondary | ICD-10-CM | POA: Diagnosis not present

## 2021-04-28 DIAGNOSIS — I10 Essential (primary) hypertension: Secondary | ICD-10-CM | POA: Diagnosis not present

## 2021-04-28 DIAGNOSIS — E669 Obesity, unspecified: Secondary | ICD-10-CM | POA: Diagnosis not present

## 2021-04-28 DIAGNOSIS — Z87891 Personal history of nicotine dependence: Secondary | ICD-10-CM | POA: Diagnosis not present

## 2021-04-28 DIAGNOSIS — M5136 Other intervertebral disc degeneration, lumbar region: Secondary | ICD-10-CM | POA: Diagnosis not present

## 2021-04-28 DIAGNOSIS — F419 Anxiety disorder, unspecified: Secondary | ICD-10-CM | POA: Diagnosis not present

## 2021-04-28 DIAGNOSIS — G4733 Obstructive sleep apnea (adult) (pediatric): Secondary | ICD-10-CM | POA: Diagnosis not present

## 2021-04-28 DIAGNOSIS — I4510 Unspecified right bundle-branch block: Secondary | ICD-10-CM | POA: Diagnosis not present

## 2021-04-28 DIAGNOSIS — M069 Rheumatoid arthritis, unspecified: Secondary | ICD-10-CM | POA: Diagnosis not present

## 2021-04-28 DIAGNOSIS — Z471 Aftercare following joint replacement surgery: Secondary | ICD-10-CM | POA: Diagnosis not present

## 2021-04-28 DIAGNOSIS — I471 Supraventricular tachycardia: Secondary | ICD-10-CM | POA: Diagnosis not present

## 2021-04-28 DIAGNOSIS — K219 Gastro-esophageal reflux disease without esophagitis: Secondary | ICD-10-CM | POA: Diagnosis not present

## 2021-04-28 DIAGNOSIS — Z6835 Body mass index (BMI) 35.0-35.9, adult: Secondary | ICD-10-CM | POA: Diagnosis not present

## 2021-04-28 DIAGNOSIS — Z87440 Personal history of urinary (tract) infections: Secondary | ICD-10-CM | POA: Diagnosis not present

## 2021-04-28 DIAGNOSIS — Z96652 Presence of left artificial knee joint: Secondary | ICD-10-CM | POA: Diagnosis not present

## 2021-05-01 DIAGNOSIS — M069 Rheumatoid arthritis, unspecified: Secondary | ICD-10-CM | POA: Diagnosis not present

## 2021-05-01 DIAGNOSIS — Z471 Aftercare following joint replacement surgery: Secondary | ICD-10-CM | POA: Diagnosis not present

## 2021-05-01 DIAGNOSIS — I4510 Unspecified right bundle-branch block: Secondary | ICD-10-CM | POA: Diagnosis not present

## 2021-05-01 DIAGNOSIS — Z87891 Personal history of nicotine dependence: Secondary | ICD-10-CM | POA: Diagnosis not present

## 2021-05-01 DIAGNOSIS — K760 Fatty (change of) liver, not elsewhere classified: Secondary | ICD-10-CM | POA: Diagnosis not present

## 2021-05-01 DIAGNOSIS — K219 Gastro-esophageal reflux disease without esophagitis: Secondary | ICD-10-CM | POA: Diagnosis not present

## 2021-05-01 DIAGNOSIS — Z96652 Presence of left artificial knee joint: Secondary | ICD-10-CM | POA: Diagnosis not present

## 2021-05-01 DIAGNOSIS — M5136 Other intervertebral disc degeneration, lumbar region: Secondary | ICD-10-CM | POA: Diagnosis not present

## 2021-05-01 DIAGNOSIS — E669 Obesity, unspecified: Secondary | ICD-10-CM | POA: Diagnosis not present

## 2021-05-01 DIAGNOSIS — I471 Supraventricular tachycardia: Secondary | ICD-10-CM | POA: Diagnosis not present

## 2021-05-01 DIAGNOSIS — G4733 Obstructive sleep apnea (adult) (pediatric): Secondary | ICD-10-CM | POA: Diagnosis not present

## 2021-05-01 DIAGNOSIS — F419 Anxiety disorder, unspecified: Secondary | ICD-10-CM | POA: Diagnosis not present

## 2021-05-01 DIAGNOSIS — Z6835 Body mass index (BMI) 35.0-35.9, adult: Secondary | ICD-10-CM | POA: Diagnosis not present

## 2021-05-01 DIAGNOSIS — I10 Essential (primary) hypertension: Secondary | ICD-10-CM | POA: Diagnosis not present

## 2021-05-01 DIAGNOSIS — F32A Depression, unspecified: Secondary | ICD-10-CM | POA: Diagnosis not present

## 2021-05-01 DIAGNOSIS — Z87440 Personal history of urinary (tract) infections: Secondary | ICD-10-CM | POA: Diagnosis not present

## 2021-05-03 DIAGNOSIS — Z87891 Personal history of nicotine dependence: Secondary | ICD-10-CM | POA: Diagnosis not present

## 2021-05-03 DIAGNOSIS — G4733 Obstructive sleep apnea (adult) (pediatric): Secondary | ICD-10-CM | POA: Diagnosis not present

## 2021-05-03 DIAGNOSIS — M5136 Other intervertebral disc degeneration, lumbar region: Secondary | ICD-10-CM | POA: Diagnosis not present

## 2021-05-03 DIAGNOSIS — I10 Essential (primary) hypertension: Secondary | ICD-10-CM | POA: Diagnosis not present

## 2021-05-03 DIAGNOSIS — K219 Gastro-esophageal reflux disease without esophagitis: Secondary | ICD-10-CM | POA: Diagnosis not present

## 2021-05-03 DIAGNOSIS — F419 Anxiety disorder, unspecified: Secondary | ICD-10-CM | POA: Diagnosis not present

## 2021-05-03 DIAGNOSIS — Z471 Aftercare following joint replacement surgery: Secondary | ICD-10-CM | POA: Diagnosis not present

## 2021-05-03 DIAGNOSIS — F32A Depression, unspecified: Secondary | ICD-10-CM | POA: Diagnosis not present

## 2021-05-03 DIAGNOSIS — Z87440 Personal history of urinary (tract) infections: Secondary | ICD-10-CM | POA: Diagnosis not present

## 2021-05-03 DIAGNOSIS — E669 Obesity, unspecified: Secondary | ICD-10-CM | POA: Diagnosis not present

## 2021-05-03 DIAGNOSIS — K760 Fatty (change of) liver, not elsewhere classified: Secondary | ICD-10-CM | POA: Diagnosis not present

## 2021-05-03 DIAGNOSIS — M069 Rheumatoid arthritis, unspecified: Secondary | ICD-10-CM | POA: Diagnosis not present

## 2021-05-03 DIAGNOSIS — Z6835 Body mass index (BMI) 35.0-35.9, adult: Secondary | ICD-10-CM | POA: Diagnosis not present

## 2021-05-03 DIAGNOSIS — I4510 Unspecified right bundle-branch block: Secondary | ICD-10-CM | POA: Diagnosis not present

## 2021-05-03 DIAGNOSIS — Z96652 Presence of left artificial knee joint: Secondary | ICD-10-CM | POA: Diagnosis not present

## 2021-05-03 DIAGNOSIS — I471 Supraventricular tachycardia: Secondary | ICD-10-CM | POA: Diagnosis not present

## 2021-05-05 DIAGNOSIS — Z471 Aftercare following joint replacement surgery: Secondary | ICD-10-CM | POA: Diagnosis not present

## 2021-05-05 DIAGNOSIS — Z6835 Body mass index (BMI) 35.0-35.9, adult: Secondary | ICD-10-CM | POA: Diagnosis not present

## 2021-05-05 DIAGNOSIS — Z87440 Personal history of urinary (tract) infections: Secondary | ICD-10-CM | POA: Diagnosis not present

## 2021-05-05 DIAGNOSIS — I4510 Unspecified right bundle-branch block: Secondary | ICD-10-CM | POA: Diagnosis not present

## 2021-05-05 DIAGNOSIS — K219 Gastro-esophageal reflux disease without esophagitis: Secondary | ICD-10-CM | POA: Diagnosis not present

## 2021-05-05 DIAGNOSIS — I471 Supraventricular tachycardia: Secondary | ICD-10-CM | POA: Diagnosis not present

## 2021-05-05 DIAGNOSIS — E669 Obesity, unspecified: Secondary | ICD-10-CM | POA: Diagnosis not present

## 2021-05-05 DIAGNOSIS — I10 Essential (primary) hypertension: Secondary | ICD-10-CM | POA: Diagnosis not present

## 2021-05-05 DIAGNOSIS — M5136 Other intervertebral disc degeneration, lumbar region: Secondary | ICD-10-CM | POA: Diagnosis not present

## 2021-05-05 DIAGNOSIS — Z87891 Personal history of nicotine dependence: Secondary | ICD-10-CM | POA: Diagnosis not present

## 2021-05-05 DIAGNOSIS — Z96652 Presence of left artificial knee joint: Secondary | ICD-10-CM | POA: Diagnosis not present

## 2021-05-05 DIAGNOSIS — G4733 Obstructive sleep apnea (adult) (pediatric): Secondary | ICD-10-CM | POA: Diagnosis not present

## 2021-05-05 DIAGNOSIS — K760 Fatty (change of) liver, not elsewhere classified: Secondary | ICD-10-CM | POA: Diagnosis not present

## 2021-05-05 DIAGNOSIS — F32A Depression, unspecified: Secondary | ICD-10-CM | POA: Diagnosis not present

## 2021-05-05 DIAGNOSIS — M069 Rheumatoid arthritis, unspecified: Secondary | ICD-10-CM | POA: Diagnosis not present

## 2021-05-05 DIAGNOSIS — F419 Anxiety disorder, unspecified: Secondary | ICD-10-CM | POA: Diagnosis not present

## 2021-05-08 DIAGNOSIS — M25562 Pain in left knee: Secondary | ICD-10-CM | POA: Diagnosis not present

## 2021-05-08 DIAGNOSIS — M25561 Pain in right knee: Secondary | ICD-10-CM | POA: Diagnosis not present

## 2021-05-29 DIAGNOSIS — I493 Ventricular premature depolarization: Secondary | ICD-10-CM | POA: Diagnosis not present

## 2021-05-29 DIAGNOSIS — I471 Supraventricular tachycardia: Secondary | ICD-10-CM | POA: Diagnosis not present

## 2021-05-29 DIAGNOSIS — E890 Postprocedural hypothyroidism: Secondary | ICD-10-CM | POA: Diagnosis not present

## 2021-05-29 DIAGNOSIS — I10 Essential (primary) hypertension: Secondary | ICD-10-CM | POA: Diagnosis not present

## 2021-06-19 DIAGNOSIS — L4050 Arthropathic psoriasis, unspecified: Secondary | ICD-10-CM | POA: Diagnosis not present

## 2021-06-19 DIAGNOSIS — M2550 Pain in unspecified joint: Secondary | ICD-10-CM | POA: Diagnosis not present

## 2021-09-21 DIAGNOSIS — M1711 Unilateral primary osteoarthritis, right knee: Secondary | ICD-10-CM | POA: Diagnosis not present

## 2021-09-22 DIAGNOSIS — G245 Blepharospasm: Secondary | ICD-10-CM | POA: Diagnosis not present

## 2021-09-22 DIAGNOSIS — G5133 Clonic hemifacial spasm, bilateral: Secondary | ICD-10-CM | POA: Diagnosis not present

## 2021-09-22 DIAGNOSIS — G5132 Clonic hemifacial spasm, left: Secondary | ICD-10-CM | POA: Diagnosis not present

## 2021-10-17 DIAGNOSIS — H6692 Otitis media, unspecified, left ear: Secondary | ICD-10-CM | POA: Diagnosis not present

## 2021-10-17 DIAGNOSIS — H60392 Other infective otitis externa, left ear: Secondary | ICD-10-CM | POA: Diagnosis not present

## 2021-11-22 DIAGNOSIS — H18832 Recurrent erosion of cornea, left eye: Secondary | ICD-10-CM | POA: Diagnosis not present

## 2021-11-26 HISTORY — PX: TOTAL KNEE ARTHROPLASTY: SHX125

## 2021-12-04 DIAGNOSIS — Z22321 Carrier or suspected carrier of Methicillin susceptible Staphylococcus aureus: Secondary | ICD-10-CM | POA: Diagnosis not present

## 2021-12-04 DIAGNOSIS — I10 Essential (primary) hypertension: Secondary | ICD-10-CM | POA: Diagnosis not present

## 2021-12-04 DIAGNOSIS — R9431 Abnormal electrocardiogram [ECG] [EKG]: Secondary | ICD-10-CM | POA: Diagnosis not present

## 2021-12-04 DIAGNOSIS — Z6835 Body mass index (BMI) 35.0-35.9, adult: Secondary | ICD-10-CM | POA: Diagnosis not present

## 2021-12-04 DIAGNOSIS — F32A Depression, unspecified: Secondary | ICD-10-CM | POA: Diagnosis not present

## 2021-12-04 DIAGNOSIS — E039 Hypothyroidism, unspecified: Secondary | ICD-10-CM | POA: Diagnosis not present

## 2021-12-04 DIAGNOSIS — F418 Other specified anxiety disorders: Secondary | ICD-10-CM | POA: Diagnosis not present

## 2021-12-04 DIAGNOSIS — E669 Obesity, unspecified: Secondary | ICD-10-CM | POA: Diagnosis not present

## 2021-12-04 DIAGNOSIS — M1711 Unilateral primary osteoarthritis, right knee: Secondary | ICD-10-CM | POA: Diagnosis not present

## 2021-12-04 DIAGNOSIS — Z01812 Encounter for preprocedural laboratory examination: Secondary | ICD-10-CM | POA: Diagnosis not present

## 2021-12-04 DIAGNOSIS — F419 Anxiety disorder, unspecified: Secondary | ICD-10-CM | POA: Diagnosis not present

## 2021-12-04 DIAGNOSIS — Z01810 Encounter for preprocedural cardiovascular examination: Secondary | ICD-10-CM | POA: Diagnosis not present

## 2021-12-04 DIAGNOSIS — Z01818 Encounter for other preprocedural examination: Secondary | ICD-10-CM | POA: Diagnosis not present

## 2021-12-04 DIAGNOSIS — M25561 Pain in right knee: Secondary | ICD-10-CM | POA: Diagnosis not present

## 2021-12-15 DIAGNOSIS — M1711 Unilateral primary osteoarthritis, right knee: Secondary | ICD-10-CM | POA: Diagnosis not present

## 2021-12-15 DIAGNOSIS — I10 Essential (primary) hypertension: Secondary | ICD-10-CM | POA: Diagnosis not present

## 2021-12-15 DIAGNOSIS — L409 Psoriasis, unspecified: Secondary | ICD-10-CM | POA: Diagnosis not present

## 2021-12-15 DIAGNOSIS — G8918 Other acute postprocedural pain: Secondary | ICD-10-CM | POA: Diagnosis not present

## 2021-12-15 DIAGNOSIS — E669 Obesity, unspecified: Secondary | ICD-10-CM | POA: Diagnosis not present

## 2021-12-15 DIAGNOSIS — M25561 Pain in right knee: Secondary | ICD-10-CM | POA: Insufficient documentation

## 2021-12-15 DIAGNOSIS — Z96651 Presence of right artificial knee joint: Secondary | ICD-10-CM | POA: Diagnosis not present

## 2021-12-15 DIAGNOSIS — K760 Fatty (change of) liver, not elsewhere classified: Secondary | ICD-10-CM | POA: Diagnosis not present

## 2021-12-15 DIAGNOSIS — Z87891 Personal history of nicotine dependence: Secondary | ICD-10-CM | POA: Diagnosis not present

## 2021-12-15 DIAGNOSIS — Z471 Aftercare following joint replacement surgery: Secondary | ICD-10-CM | POA: Diagnosis not present

## 2021-12-15 DIAGNOSIS — K219 Gastro-esophageal reflux disease without esophagitis: Secondary | ICD-10-CM | POA: Diagnosis not present

## 2021-12-15 DIAGNOSIS — G4733 Obstructive sleep apnea (adult) (pediatric): Secondary | ICD-10-CM | POA: Diagnosis not present

## 2021-12-15 DIAGNOSIS — Z6835 Body mass index (BMI) 35.0-35.9, adult: Secondary | ICD-10-CM | POA: Diagnosis not present

## 2021-12-15 DIAGNOSIS — Z8616 Personal history of COVID-19: Secondary | ICD-10-CM | POA: Diagnosis not present

## 2021-12-15 DIAGNOSIS — E039 Hypothyroidism, unspecified: Secondary | ICD-10-CM | POA: Diagnosis not present

## 2021-12-15 DIAGNOSIS — M069 Rheumatoid arthritis, unspecified: Secondary | ICD-10-CM | POA: Diagnosis not present

## 2021-12-16 DIAGNOSIS — E669 Obesity, unspecified: Secondary | ICD-10-CM | POA: Diagnosis not present

## 2021-12-16 DIAGNOSIS — Z6835 Body mass index (BMI) 35.0-35.9, adult: Secondary | ICD-10-CM | POA: Diagnosis not present

## 2021-12-16 DIAGNOSIS — K219 Gastro-esophageal reflux disease without esophagitis: Secondary | ICD-10-CM | POA: Diagnosis not present

## 2021-12-16 DIAGNOSIS — M1711 Unilateral primary osteoarthritis, right knee: Secondary | ICD-10-CM | POA: Insufficient documentation

## 2021-12-16 DIAGNOSIS — G4733 Obstructive sleep apnea (adult) (pediatric): Secondary | ICD-10-CM | POA: Diagnosis not present

## 2021-12-16 DIAGNOSIS — L409 Psoriasis, unspecified: Secondary | ICD-10-CM | POA: Diagnosis not present

## 2021-12-16 DIAGNOSIS — I10 Essential (primary) hypertension: Secondary | ICD-10-CM | POA: Diagnosis not present

## 2021-12-16 DIAGNOSIS — E039 Hypothyroidism, unspecified: Secondary | ICD-10-CM | POA: Diagnosis not present

## 2021-12-16 DIAGNOSIS — Z87891 Personal history of nicotine dependence: Secondary | ICD-10-CM | POA: Diagnosis not present

## 2021-12-16 DIAGNOSIS — M069 Rheumatoid arthritis, unspecified: Secondary | ICD-10-CM | POA: Diagnosis not present

## 2021-12-16 DIAGNOSIS — K760 Fatty (change of) liver, not elsewhere classified: Secondary | ICD-10-CM | POA: Diagnosis not present

## 2021-12-16 DIAGNOSIS — Z8616 Personal history of COVID-19: Secondary | ICD-10-CM | POA: Diagnosis not present

## 2021-12-28 DIAGNOSIS — M25661 Stiffness of right knee, not elsewhere classified: Secondary | ICD-10-CM | POA: Diagnosis not present

## 2021-12-28 DIAGNOSIS — M25561 Pain in right knee: Secondary | ICD-10-CM | POA: Diagnosis not present

## 2021-12-28 DIAGNOSIS — Z96651 Presence of right artificial knee joint: Secondary | ICD-10-CM | POA: Diagnosis not present

## 2021-12-28 DIAGNOSIS — R2681 Unsteadiness on feet: Secondary | ICD-10-CM | POA: Diagnosis not present

## 2021-12-28 DIAGNOSIS — M6281 Muscle weakness (generalized): Secondary | ICD-10-CM | POA: Diagnosis not present

## 2021-12-28 DIAGNOSIS — M25461 Effusion, right knee: Secondary | ICD-10-CM | POA: Diagnosis not present

## 2021-12-29 DIAGNOSIS — Z96651 Presence of right artificial knee joint: Secondary | ICD-10-CM | POA: Diagnosis not present

## 2021-12-29 DIAGNOSIS — M25661 Stiffness of right knee, not elsewhere classified: Secondary | ICD-10-CM | POA: Diagnosis not present

## 2021-12-29 DIAGNOSIS — M25561 Pain in right knee: Secondary | ICD-10-CM | POA: Diagnosis not present

## 2021-12-29 DIAGNOSIS — R2681 Unsteadiness on feet: Secondary | ICD-10-CM | POA: Diagnosis not present

## 2021-12-29 DIAGNOSIS — M25461 Effusion, right knee: Secondary | ICD-10-CM | POA: Diagnosis not present

## 2021-12-29 DIAGNOSIS — M6281 Muscle weakness (generalized): Secondary | ICD-10-CM | POA: Diagnosis not present

## 2022-01-01 DIAGNOSIS — M25461 Effusion, right knee: Secondary | ICD-10-CM | POA: Diagnosis not present

## 2022-01-01 DIAGNOSIS — Z96651 Presence of right artificial knee joint: Secondary | ICD-10-CM | POA: Diagnosis not present

## 2022-01-01 DIAGNOSIS — M25661 Stiffness of right knee, not elsewhere classified: Secondary | ICD-10-CM | POA: Diagnosis not present

## 2022-01-01 DIAGNOSIS — M6281 Muscle weakness (generalized): Secondary | ICD-10-CM | POA: Diagnosis not present

## 2022-01-01 DIAGNOSIS — M25561 Pain in right knee: Secondary | ICD-10-CM | POA: Diagnosis not present

## 2022-01-01 DIAGNOSIS — R2681 Unsteadiness on feet: Secondary | ICD-10-CM | POA: Diagnosis not present

## 2022-01-03 DIAGNOSIS — M25561 Pain in right knee: Secondary | ICD-10-CM | POA: Diagnosis not present

## 2022-01-03 DIAGNOSIS — Z96651 Presence of right artificial knee joint: Secondary | ICD-10-CM | POA: Diagnosis not present

## 2022-01-03 DIAGNOSIS — R2681 Unsteadiness on feet: Secondary | ICD-10-CM | POA: Diagnosis not present

## 2022-01-03 DIAGNOSIS — Z23 Encounter for immunization: Secondary | ICD-10-CM | POA: Diagnosis not present

## 2022-01-03 DIAGNOSIS — M25461 Effusion, right knee: Secondary | ICD-10-CM | POA: Diagnosis not present

## 2022-01-03 DIAGNOSIS — Z0000 Encounter for general adult medical examination without abnormal findings: Secondary | ICD-10-CM | POA: Diagnosis not present

## 2022-01-03 DIAGNOSIS — M6281 Muscle weakness (generalized): Secondary | ICD-10-CM | POA: Diagnosis not present

## 2022-01-03 DIAGNOSIS — M25661 Stiffness of right knee, not elsewhere classified: Secondary | ICD-10-CM | POA: Diagnosis not present

## 2022-01-05 DIAGNOSIS — Z96651 Presence of right artificial knee joint: Secondary | ICD-10-CM | POA: Diagnosis not present

## 2022-01-05 DIAGNOSIS — R2681 Unsteadiness on feet: Secondary | ICD-10-CM | POA: Diagnosis not present

## 2022-01-05 DIAGNOSIS — M25561 Pain in right knee: Secondary | ICD-10-CM | POA: Diagnosis not present

## 2022-01-05 DIAGNOSIS — M25461 Effusion, right knee: Secondary | ICD-10-CM | POA: Diagnosis not present

## 2022-01-05 DIAGNOSIS — M6281 Muscle weakness (generalized): Secondary | ICD-10-CM | POA: Diagnosis not present

## 2022-01-05 DIAGNOSIS — M25661 Stiffness of right knee, not elsewhere classified: Secondary | ICD-10-CM | POA: Diagnosis not present

## 2022-01-09 DIAGNOSIS — M25561 Pain in right knee: Secondary | ICD-10-CM | POA: Diagnosis not present

## 2022-01-09 DIAGNOSIS — Z96651 Presence of right artificial knee joint: Secondary | ICD-10-CM | POA: Diagnosis not present

## 2022-01-09 DIAGNOSIS — R2681 Unsteadiness on feet: Secondary | ICD-10-CM | POA: Diagnosis not present

## 2022-01-09 DIAGNOSIS — M6281 Muscle weakness (generalized): Secondary | ICD-10-CM | POA: Diagnosis not present

## 2022-01-09 DIAGNOSIS — M25461 Effusion, right knee: Secondary | ICD-10-CM | POA: Diagnosis not present

## 2022-01-09 DIAGNOSIS — M25661 Stiffness of right knee, not elsewhere classified: Secondary | ICD-10-CM | POA: Diagnosis not present

## 2022-01-11 DIAGNOSIS — R2681 Unsteadiness on feet: Secondary | ICD-10-CM | POA: Diagnosis not present

## 2022-01-11 DIAGNOSIS — M6281 Muscle weakness (generalized): Secondary | ICD-10-CM | POA: Diagnosis not present

## 2022-01-11 DIAGNOSIS — M25461 Effusion, right knee: Secondary | ICD-10-CM | POA: Diagnosis not present

## 2022-01-11 DIAGNOSIS — M25561 Pain in right knee: Secondary | ICD-10-CM | POA: Diagnosis not present

## 2022-01-11 DIAGNOSIS — M25661 Stiffness of right knee, not elsewhere classified: Secondary | ICD-10-CM | POA: Diagnosis not present

## 2022-01-11 DIAGNOSIS — Z96651 Presence of right artificial knee joint: Secondary | ICD-10-CM | POA: Diagnosis not present

## 2022-01-12 DIAGNOSIS — Z96651 Presence of right artificial knee joint: Secondary | ICD-10-CM | POA: Diagnosis not present

## 2022-01-12 DIAGNOSIS — M6281 Muscle weakness (generalized): Secondary | ICD-10-CM | POA: Diagnosis not present

## 2022-01-12 DIAGNOSIS — M25561 Pain in right knee: Secondary | ICD-10-CM | POA: Diagnosis not present

## 2022-01-12 DIAGNOSIS — M25661 Stiffness of right knee, not elsewhere classified: Secondary | ICD-10-CM | POA: Diagnosis not present

## 2022-01-12 DIAGNOSIS — R2681 Unsteadiness on feet: Secondary | ICD-10-CM | POA: Diagnosis not present

## 2022-01-12 DIAGNOSIS — M25461 Effusion, right knee: Secondary | ICD-10-CM | POA: Diagnosis not present

## 2022-01-15 DIAGNOSIS — M25461 Effusion, right knee: Secondary | ICD-10-CM | POA: Diagnosis not present

## 2022-01-15 DIAGNOSIS — Z96651 Presence of right artificial knee joint: Secondary | ICD-10-CM | POA: Diagnosis not present

## 2022-01-15 DIAGNOSIS — G5133 Clonic hemifacial spasm, bilateral: Secondary | ICD-10-CM | POA: Diagnosis not present

## 2022-01-15 DIAGNOSIS — G245 Blepharospasm: Secondary | ICD-10-CM | POA: Diagnosis not present

## 2022-01-15 DIAGNOSIS — M25661 Stiffness of right knee, not elsewhere classified: Secondary | ICD-10-CM | POA: Diagnosis not present

## 2022-01-15 DIAGNOSIS — M6281 Muscle weakness (generalized): Secondary | ICD-10-CM | POA: Diagnosis not present

## 2022-01-15 DIAGNOSIS — R2681 Unsteadiness on feet: Secondary | ICD-10-CM | POA: Diagnosis not present

## 2022-01-15 DIAGNOSIS — M25561 Pain in right knee: Secondary | ICD-10-CM | POA: Diagnosis not present

## 2022-01-15 DIAGNOSIS — G5132 Clonic hemifacial spasm, left: Secondary | ICD-10-CM | POA: Diagnosis not present

## 2022-01-17 DIAGNOSIS — R2681 Unsteadiness on feet: Secondary | ICD-10-CM | POA: Diagnosis not present

## 2022-01-17 DIAGNOSIS — M25661 Stiffness of right knee, not elsewhere classified: Secondary | ICD-10-CM | POA: Diagnosis not present

## 2022-01-17 DIAGNOSIS — Z96651 Presence of right artificial knee joint: Secondary | ICD-10-CM | POA: Diagnosis not present

## 2022-01-17 DIAGNOSIS — M25561 Pain in right knee: Secondary | ICD-10-CM | POA: Diagnosis not present

## 2022-01-17 DIAGNOSIS — M25461 Effusion, right knee: Secondary | ICD-10-CM | POA: Diagnosis not present

## 2022-01-17 DIAGNOSIS — M6281 Muscle weakness (generalized): Secondary | ICD-10-CM | POA: Diagnosis not present

## 2022-01-22 DIAGNOSIS — M25561 Pain in right knee: Secondary | ICD-10-CM | POA: Diagnosis not present

## 2022-01-22 DIAGNOSIS — M25661 Stiffness of right knee, not elsewhere classified: Secondary | ICD-10-CM | POA: Diagnosis not present

## 2022-01-22 DIAGNOSIS — M25461 Effusion, right knee: Secondary | ICD-10-CM | POA: Diagnosis not present

## 2022-01-22 DIAGNOSIS — M6281 Muscle weakness (generalized): Secondary | ICD-10-CM | POA: Diagnosis not present

## 2022-01-22 DIAGNOSIS — R2681 Unsteadiness on feet: Secondary | ICD-10-CM | POA: Diagnosis not present

## 2022-01-22 DIAGNOSIS — Z96651 Presence of right artificial knee joint: Secondary | ICD-10-CM | POA: Diagnosis not present

## 2022-01-24 DIAGNOSIS — M25661 Stiffness of right knee, not elsewhere classified: Secondary | ICD-10-CM | POA: Diagnosis not present

## 2022-01-24 DIAGNOSIS — M25561 Pain in right knee: Secondary | ICD-10-CM | POA: Diagnosis not present

## 2022-01-24 DIAGNOSIS — M6281 Muscle weakness (generalized): Secondary | ICD-10-CM | POA: Diagnosis not present

## 2022-01-24 DIAGNOSIS — Z96651 Presence of right artificial knee joint: Secondary | ICD-10-CM | POA: Diagnosis not present

## 2022-01-24 DIAGNOSIS — M25461 Effusion, right knee: Secondary | ICD-10-CM | POA: Diagnosis not present

## 2022-01-24 DIAGNOSIS — R2681 Unsteadiness on feet: Secondary | ICD-10-CM | POA: Diagnosis not present

## 2022-01-29 DIAGNOSIS — M25461 Effusion, right knee: Secondary | ICD-10-CM | POA: Diagnosis not present

## 2022-01-29 DIAGNOSIS — R2681 Unsteadiness on feet: Secondary | ICD-10-CM | POA: Diagnosis not present

## 2022-01-29 DIAGNOSIS — M6281 Muscle weakness (generalized): Secondary | ICD-10-CM | POA: Diagnosis not present

## 2022-01-29 DIAGNOSIS — H18832 Recurrent erosion of cornea, left eye: Secondary | ICD-10-CM | POA: Diagnosis not present

## 2022-01-29 DIAGNOSIS — M25561 Pain in right knee: Secondary | ICD-10-CM | POA: Diagnosis not present

## 2022-01-29 DIAGNOSIS — Z96651 Presence of right artificial knee joint: Secondary | ICD-10-CM | POA: Diagnosis not present

## 2022-01-29 DIAGNOSIS — M25661 Stiffness of right knee, not elsewhere classified: Secondary | ICD-10-CM | POA: Diagnosis not present

## 2022-02-01 DIAGNOSIS — R2681 Unsteadiness on feet: Secondary | ICD-10-CM | POA: Diagnosis not present

## 2022-02-01 DIAGNOSIS — M25461 Effusion, right knee: Secondary | ICD-10-CM | POA: Diagnosis not present

## 2022-02-01 DIAGNOSIS — M25661 Stiffness of right knee, not elsewhere classified: Secondary | ICD-10-CM | POA: Diagnosis not present

## 2022-02-01 DIAGNOSIS — M25561 Pain in right knee: Secondary | ICD-10-CM | POA: Diagnosis not present

## 2022-02-01 DIAGNOSIS — M6281 Muscle weakness (generalized): Secondary | ICD-10-CM | POA: Diagnosis not present

## 2022-02-01 DIAGNOSIS — Z96651 Presence of right artificial knee joint: Secondary | ICD-10-CM | POA: Diagnosis not present

## 2022-02-05 DIAGNOSIS — Z1231 Encounter for screening mammogram for malignant neoplasm of breast: Secondary | ICD-10-CM | POA: Diagnosis not present

## 2022-02-06 DIAGNOSIS — R531 Weakness: Secondary | ICD-10-CM | POA: Diagnosis not present

## 2022-02-06 DIAGNOSIS — L400 Psoriasis vulgaris: Secondary | ICD-10-CM | POA: Diagnosis not present

## 2022-02-06 DIAGNOSIS — Z111 Encounter for screening for respiratory tuberculosis: Secondary | ICD-10-CM | POA: Diagnosis not present

## 2022-02-06 DIAGNOSIS — L4050 Arthropathic psoriasis, unspecified: Secondary | ICD-10-CM | POA: Diagnosis not present

## 2022-02-07 DIAGNOSIS — M25661 Stiffness of right knee, not elsewhere classified: Secondary | ICD-10-CM | POA: Diagnosis not present

## 2022-02-07 DIAGNOSIS — M25561 Pain in right knee: Secondary | ICD-10-CM | POA: Diagnosis not present

## 2022-02-07 DIAGNOSIS — M25461 Effusion, right knee: Secondary | ICD-10-CM | POA: Diagnosis not present

## 2022-02-07 DIAGNOSIS — Z96651 Presence of right artificial knee joint: Secondary | ICD-10-CM | POA: Diagnosis not present

## 2022-02-07 DIAGNOSIS — M6281 Muscle weakness (generalized): Secondary | ICD-10-CM | POA: Diagnosis not present

## 2022-02-07 DIAGNOSIS — R2681 Unsteadiness on feet: Secondary | ICD-10-CM | POA: Diagnosis not present

## 2022-02-13 DIAGNOSIS — M25461 Effusion, right knee: Secondary | ICD-10-CM | POA: Diagnosis not present

## 2022-02-13 DIAGNOSIS — M25661 Stiffness of right knee, not elsewhere classified: Secondary | ICD-10-CM | POA: Diagnosis not present

## 2022-02-13 DIAGNOSIS — R2681 Unsteadiness on feet: Secondary | ICD-10-CM | POA: Diagnosis not present

## 2022-02-13 DIAGNOSIS — M6281 Muscle weakness (generalized): Secondary | ICD-10-CM | POA: Diagnosis not present

## 2022-02-13 DIAGNOSIS — M25561 Pain in right knee: Secondary | ICD-10-CM | POA: Diagnosis not present

## 2022-02-13 DIAGNOSIS — Z96651 Presence of right artificial knee joint: Secondary | ICD-10-CM | POA: Diagnosis not present

## 2022-02-21 DIAGNOSIS — J208 Acute bronchitis due to other specified organisms: Secondary | ICD-10-CM | POA: Diagnosis not present

## 2022-02-21 DIAGNOSIS — J019 Acute sinusitis, unspecified: Secondary | ICD-10-CM | POA: Diagnosis not present

## 2022-02-27 DIAGNOSIS — D3501 Benign neoplasm of right adrenal gland: Secondary | ICD-10-CM | POA: Diagnosis not present

## 2022-02-27 DIAGNOSIS — E890 Postprocedural hypothyroidism: Secondary | ICD-10-CM | POA: Diagnosis not present

## 2022-03-12 DIAGNOSIS — M25561 Pain in right knee: Secondary | ICD-10-CM | POA: Diagnosis not present

## 2022-03-19 DIAGNOSIS — H18523 Epithelial (juvenile) corneal dystrophy, bilateral: Secondary | ICD-10-CM | POA: Diagnosis not present

## 2022-03-27 DIAGNOSIS — H04203 Unspecified epiphora, bilateral lacrimal glands: Secondary | ICD-10-CM | POA: Diagnosis not present

## 2022-04-10 DIAGNOSIS — G5132 Clonic hemifacial spasm, left: Secondary | ICD-10-CM | POA: Diagnosis not present

## 2022-04-10 DIAGNOSIS — G5133 Clonic hemifacial spasm, bilateral: Secondary | ICD-10-CM | POA: Diagnosis not present

## 2022-04-10 DIAGNOSIS — G245 Blepharospasm: Secondary | ICD-10-CM | POA: Diagnosis not present

## 2022-05-29 DIAGNOSIS — I493 Ventricular premature depolarization: Secondary | ICD-10-CM | POA: Diagnosis not present

## 2022-05-29 DIAGNOSIS — I10 Essential (primary) hypertension: Secondary | ICD-10-CM | POA: Diagnosis not present

## 2022-05-29 DIAGNOSIS — I471 Supraventricular tachycardia, unspecified: Secondary | ICD-10-CM | POA: Diagnosis not present

## 2022-05-29 DIAGNOSIS — Z1330 Encounter for screening examination for mental health and behavioral disorders, unspecified: Secondary | ICD-10-CM | POA: Diagnosis not present

## 2022-09-13 ENCOUNTER — Encounter: Payer: Self-pay | Admitting: Gastroenterology

## 2022-10-08 ENCOUNTER — Ambulatory Visit (AMBULATORY_SURGERY_CENTER): Payer: BLUE CROSS/BLUE SHIELD

## 2022-10-08 VITALS — Ht 66.0 in | Wt 218.0 lb

## 2022-10-08 DIAGNOSIS — Z86010 Personal history of colonic polyps: Secondary | ICD-10-CM

## 2022-10-08 MED ORDER — NA SULFATE-K SULFATE-MG SULF 17.5-3.13-1.6 GM/177ML PO SOLN
1.0000 | Freq: Once | ORAL | 0 refills | Status: AC
Start: 2022-10-08 — End: 2022-10-08

## 2022-10-08 NOTE — Progress Notes (Signed)
No egg or soy allergy known to patient  No issues known to pt with past sedation with any surgeries or procedures Patient denies ever being told they had issues or difficulty with intubation  No FH of Malignant Hyperthermia Pt is not on diet pills Pt is not on  home 02  Pt is not on blood thinners  Pt denies issues with constipation  No  A flutter Have any cardiac testing pending--no Pt instructed to use Singlecare.com or GoodRx for a price reduction on prep    Patient's chart reviewed by Cathlyn Parsons CNRA prior to previsit and patient appropriate for the [facility].  Previsit completed and red dot placed by patient's name on their procedure day (on provider's schedule).

## 2022-10-22 ENCOUNTER — Encounter: Payer: Self-pay | Admitting: Gastroenterology

## 2022-10-22 ENCOUNTER — Ambulatory Visit (AMBULATORY_SURGERY_CENTER): Payer: Medicare Other | Admitting: Gastroenterology

## 2022-10-22 VITALS — BP 122/59 | HR 54 | Temp 98.0°F | Resp 18 | Ht 66.0 in | Wt 218.0 lb

## 2022-10-22 DIAGNOSIS — Z800 Family history of malignant neoplasm of digestive organs: Secondary | ICD-10-CM | POA: Diagnosis not present

## 2022-10-22 DIAGNOSIS — Z1211 Encounter for screening for malignant neoplasm of colon: Secondary | ICD-10-CM

## 2022-10-22 DIAGNOSIS — Z86010 Personal history of colonic polyps: Secondary | ICD-10-CM

## 2022-10-22 MED ORDER — SODIUM CHLORIDE 0.9 % IV SOLN: 500.0000 mL | Freq: Once | INTRAVENOUS | Status: DC

## 2022-10-22 NOTE — Progress Notes (Unsigned)
[facility] History and Physical   Primary Care Physician:  Paulina Fusi, MD   Reason for Procedure:   FH CRC - dad  at age 67  Plan:    colon     HPI: Tina Welch is a 67 y.o. female    Past Medical History:  Diagnosis Date   A-fib (HCC)    Allergy    Hypothyroid    Hypothyroidism    Nephrolithiasis    Psoriatic arthritis (HCC)    Rheumatoid arthritis (HCC)    Right bundle branch block    Seizures (HCC) 2014   states from taking Avelox, no seizure since then   Sleep apnea    SVT (supraventricular tachycardia)     Past Surgical History:  Procedure Laterality Date   ABDOMINAL HYSTERECTOMY     CHOLECYSTECTOMY     COLONOSCOPY  03/08/2008   Mild sigmoid diverticulosis. Otherwise normal colonoscopy    ESOPHAGOGASTRODUODENOSCOPY  03/26/2005   Moderate gastritis (likely due to nonsteroidals which patient has been using for her back pain) Rule out helicobacter pylori. Otherwise normal EGD   REPLACEMENT TOTAL KNEE Right 03/2021   TOTAL KNEE ARTHROPLASTY Left 11/2021   TRIGEMINAL NERVE DECOMPRESSION  2019   UPPER GASTROINTESTINAL ENDOSCOPY      Prior to Admission medications   Medication Sig Start Date End Date Taking? Authorizing Provider  acetaminophen (TYLENOL) 650 MG CR tablet Take by mouth.   Yes [provider]  aspirin EC 325 MG tablet Take by mouth.   Yes [provider]  cetirizine (ZYRTEC) 10 MG tablet Take 10 mg by mouth at bedtime.   Yes [provider]  citalopram (CELEXA) 20 MG tablet Take by mouth. 11/30/15  Yes [provider]  cycloSPORINE (RESTASIS) 0.05 % ophthalmic emulsion Apply to eye. 06/20/20  Yes [provider]  levothyroxine (SYNTHROID) 125 MCG tablet Take 125 mcg by mouth daily.   Yes [provider]  metoprolol tartrate (LOPRESSOR) 25 MG tablet Take 25 mg by mouth 2 (two) times daily.   Yes [provider]  propafenone (RYTHMOL SR) 225 MG 12 hr capsule Take by  mouth. 06/02/14  Yes [provider]  selenium 200 MCG TABS tablet Take by mouth. 11/30/15  Yes [provider]  clobetasol ointment (TEMOVATE) 0.05 % Apply topically.    [provider]  erythromycin ophthalmic ointment 3 (three) times daily. 06/19/22   [provider]  meloxicam (MOBIC) 15 MG tablet Take 15 mg by mouth daily.    [provider]  omeprazole (PRILOSEC) 20 MG capsule Take by mouth. Patient not taking: Reported on 10/22/2022 06/22/14   [provider]    Current Outpatient Medications  Medication Sig Dispense Refill   acetaminophen (TYLENOL) 650 MG CR tablet Take by mouth.     aspirin EC 325 MG tablet Take by mouth.     cetirizine (ZYRTEC) 10 MG tablet Take 10 mg by mouth at bedtime.     citalopram (CELEXA) 20 MG tablet Take by mouth.     cycloSPORINE (RESTASIS) 0.05 % ophthalmic emulsion Apply to eye.     levothyroxine (SYNTHROID) 125 MCG tablet Take 125 mcg by mouth daily.     metoprolol tartrate (LOPRESSOR) 25 MG tablet Take 25 mg by mouth 2 (two) times daily.     propafenone (RYTHMOL SR) 225 MG 12 hr capsule Take by mouth.     selenium 200 MCG TABS tablet Take by mouth.     clobetasol ointment (TEMOVATE) 0.05 %  Apply topically.     erythromycin ophthalmic ointment 3 (three) times daily.     meloxicam (MOBIC) 15 MG tablet Take 15 mg by mouth daily.     omeprazole (PRILOSEC) 20 MG capsule Take by mouth. (Patient not taking: Reported on 10/22/2022)     Current Facility-Administered Medications  Medication Dose Route Frequency Provider Last Rate Last Admin   0.9 %  sodium chloride infusion  500 mL Intravenous Once Lynann Bologna, MD        Allergies as of 10/22/2022 - Review Complete 10/22/2022  Allergen Reaction Noted   Avelox [moxifloxacin hcl in nacl] Anaphylaxis 07/30/2017   Valium [diazepam]  07/30/2017   Morphine and codeine Rash 07/30/2017    Family History  Problem Relation Age of Onset   Colon polyps Father     Colon cancer Father    Colon polyps Brother    Stomach cancer Maternal Grandfather    Esophageal cancer Neg Hx    Rectal cancer Neg Hx     Social History   Socioeconomic History   Marital status: Married    Spouse name: Not on file   Number of children: Not on file   Years of education: Not on file   Highest education level: Not on file  Occupational History   Not on file  Tobacco Use   Smoking status: Former    Average packs/day: 0.5 packs/day for 15.0 years (7.5 ttl pk-yrs)    Types: Cigarettes    Start date: 2000   Smokeless tobacco: Never  Vaping Use   Vaping status: Never Used  Substance and Sexual Activity   Alcohol use: Yes    Comment: rarely   Drug use: Never   Sexual activity: Yes    Birth control/protection: Surgical, Post-menopausal  Other Topics Concern   Not on file  Social History Narrative   Not on file   Social Determinants of Health   Financial Resource Strain: Low Risk  (05/28/2022)   Received from [facility], [facility]   Overall Financial Resource Strain (CARDIA)    Difficulty of Paying Living Expenses: Not hard at all  Food Insecurity: No Food Insecurity (05/28/2022)   Received from [facility], [facility]   Hunger Vital Sign    Worried About Running Out of Food in the Last Year: Never true    Ran Out of Food in the Last Year: Never true  Transportation Needs: No Transportation Needs (05/28/2022)   Received from [employer], [facility]   PRAPARE - Transportation    Lack of Transportation (Medical): No    Lack of Transportation (Non-Medical): No  Physical Activity: Insufficiently Active (05/28/2022)   Received from [facility], [facility]   Exercise Vital Sign    Days of Exercise per Week: 2 days    Minutes of Exercise per Session: 20 min  Stress: No Stress Concern Present (05/28/2022)   Received from [facility] of Occupational Health - Occupational Stress Questionnaire    Feeling of  Stress : Not at all  Social Connections: Socially Integrated (05/28/2022)   Received from [facility], [facility]   Social Network    How would you rate your social network (family, work, friends)?: Good participation with social networks  Intimate Partner Violence: Not At Risk (05/28/2022)   Received from [facility], [facility]   HITS    Over the last 12 months how often did your partner physically hurt you?: 1    Over the  last 12 months how often did your partner insult you or talk down to you?: 2    Over the last 12 months how often did your partner threaten you with physical harm?: 1    Over the last 12 months how often did your partner scream or curse at you?: 2    Review of Systems: Positive for none All other review of systems negative except as mentioned in the HPI.  Physical Exam: Vital signs in last 24 hours: @VSRANGES @   General:   Alert,  Well-developed, well-nourished, pleasant and cooperative in NAD Lungs:  Clear throughout to auscultation.   Heart:  Regular rate and rhythm; no murmurs, clicks, rubs,  or gallops. Abdomen:  Soft, nontender and nondistended. Normal bowel sounds.   Neuro/Psych:  Alert and cooperative. Normal mood and affect. A and O x 3    No significant changes were identified.  The patient continues to be an appropriate candidate for the planned procedure and anesthesia.   Edman Circle, MD. [facility] 10/22/2022 3:22 PM@

## 2022-10-22 NOTE — Progress Notes (Signed)
Pt's states no medical or surgical changes since previsit or office visit. 

## 2022-10-22 NOTE — Patient Instructions (Signed)

## 2022-10-22 NOTE — Progress Notes (Unsigned)
To pacu, VSS. Report to [email or web address] 

## 2022-10-22 NOTE — Op Note (Signed)
[facility] Patient Name: Tina Welch Procedure Date: 10/22/2022 3:21 PM MRN: [id number] Endoscopist: Lynann Bologna , MD, [id number] Age: 67 Referring MD:  Date of Birth: 27-Apr-1955 Gender: Female Account #: [id number] Procedure:                Colonoscopy Indications:              Screening in patient at increased risk: Colorectal                            cancer in father 15 or older. Neg colon 2010 Medicines:                Monitored Anesthesia Care Procedure:                Pre-Anesthesia Assessment:                           - Prior to the procedure, a History and Physical                            was performed, and patient medications and                            allergies were reviewed. The patient's tolerance of                            previous anesthesia was also reviewed. The risks                            and benefits of the procedure and the sedation                            options and risks were discussed with the patient.                            All questions were answered, and informed consent                            was obtained. Prior Anticoagulants: The patient has                            taken no anticoagulant or antiplatelet agents. ASA                            Grade Assessment: II - A patient with mild systemic                            disease. After reviewing the risks and benefits,                            the patient was deemed in satisfactory condition to                            undergo the procedure.  After obtaining informed consent, the colonoscope                            was passed under direct vision. Throughout the                            procedure, the patient's blood pressure, pulse, and                            oxygen saturations were monitored continuously. The                            Olympus Scope CF SN [id number] was introduced through                            the anus and  advanced to the 2 cm into the ileum.                            The colonoscopy was performed without difficulty.                            The patient tolerated the procedure well. The                            quality of the bowel preparation was adequate to                            identify polyps. The terminal ileum, ileocecal                            valve, appendiceal orifice, and rectum were                            photographed. Scope In: 3:30:12 PM Scope Out: 3:45:15 PM Scope Withdrawal Time: 0 hours 10 minutes 18 seconds  Total Procedure Duration: 0 hours 15 minutes 3 seconds  Findings:                 Multiple medium-mouthed diverticula were found in                            the sigmoid colon, few in descending colon and                            ascending colon. Several diverticula in the sigmoid                            colon with stool impacted. No endoscopic evidence                            of diverticulitis.                           Non-bleeding external and internal hemorrhoids were  found during retroflexion and during perianal exam.                            The hemorrhoids were moderate and Grade I (internal                            hemorrhoids that do not prolapse).                           The terminal ileum appeared normal.                           The exam was otherwise without abnormality on                            direct and retroflexion views. Complications:            No immediate complications. Estimated Blood Loss:     Estimated blood loss: none. Impression:               - Pancolonic diverticulosis predominantly in the                            sigmoid colon.                           - Non-bleeding external and internal hemorrhoids.                           - The examined portion of the ileum was normal.                           - The examination was otherwise normal on direct                             and retroflexion views.                           - No specimens collected. Recommendation:           - Patient has a contact number available for                            emergencies. The signs and symptoms of potential                            delayed complications were discussed with the                            patient. Return to normal activities tomorrow.                            Written discharge instructions were provided to the                            patient.                           -  High fiber diet.                           - Continue present medications.                           - Repeat colonoscopy is not recommended due to                            current age (43 years or older) for screening                            purposes. Hence, repeat colonoscopy only if with                            any new problems.                           - The findings and recommendations were discussed                            with the patient's family. Lynann Bologna, MD 10/22/2022 3:50:14 PM This report has been signed electronically.

## 2022-10-23 ENCOUNTER — Telehealth: Payer: Self-pay | Admitting: *Deleted

## 2022-10-23 NOTE — Telephone Encounter (Signed)
  Follow up Call-     10/22/2022    2:52 PM  Call back number  Post procedure Call Back phone  # [phone]  Permission to leave phone message Yes     Patient questions:  Do you have a fever, pain , or abdominal swelling? No. Pain Score  0 *  Have you tolerated food without any problems? Yes.    Have you been able to return to your normal activities? Yes.    Do you have any questions about your discharge instructions: Diet   No. Medications  No. Follow up visit  No.  Do you have questions or concerns about your Care? No.  Actions: * If pain score is 4 or above: No action needed, pain <4.
